# Patient Record
Sex: Female | Born: 1975 | Race: White | Hispanic: No | Marital: Married | Smoking: Former smoker
Health system: Southern US, Community
[De-identification: ages and names within clinical notes are randomized; demographics above are authoritative.]

## PROBLEM LIST (undated history)

## (undated) DIAGNOSIS — E079 Disorder of thyroid, unspecified: Secondary | ICD-10-CM

## (undated) DIAGNOSIS — M1990 Unspecified osteoarthritis, unspecified site: Secondary | ICD-10-CM

## (undated) DIAGNOSIS — K589 Irritable bowel syndrome without diarrhea: Secondary | ICD-10-CM

## (undated) DIAGNOSIS — D126 Benign neoplasm of colon, unspecified: Secondary | ICD-10-CM

## (undated) HISTORY — PX: LEEP: SHX91

## (undated) HISTORY — PX: COLONOSCOPY: SHX174

## (undated) HISTORY — DX: Irritable bowel syndrome, unspecified: K58.9

## (undated) HISTORY — DX: Disorder of thyroid, unspecified: E07.9

## (undated) HISTORY — DX: Unspecified osteoarthritis, unspecified site: M19.90

---
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
## 2015-01-24 ENCOUNTER — Other Ambulatory Visit: Payer: Self-pay | Admitting: Family

## 2015-01-24 ENCOUNTER — Other Ambulatory Visit: Payer: Self-pay

## 2015-01-24 ENCOUNTER — Other Ambulatory Visit (HOSPITAL_COMMUNITY)
Admission: RE | Admit: 2015-01-24 | Discharge: 2015-01-24 | Disposition: A | Discharge status: Home / Self Care | Payer: 59 | Source: Ambulatory Visit | Attending: Family Medicine | Admitting: Family Medicine

## 2015-01-24 DIAGNOSIS — Z01419 Encounter for gynecological examination (general) (routine) without abnormal findings: Secondary | ICD-10-CM | POA: Insufficient documentation

## 2015-01-24 DIAGNOSIS — Z1231 Encounter for screening mammogram for malignant neoplasm of breast: Secondary | ICD-10-CM

## 2015-01-25 LAB — CYTOLOGY - PAP

## 2015-02-01 ENCOUNTER — Ambulatory Visit
Admission: RE | Admit: 2015-02-01 | Discharge: 2015-02-01 | Disposition: A | Discharge status: Home / Self Care | Payer: 59 | Source: Ambulatory Visit | Attending: Family | Admitting: Family

## 2015-02-01 DIAGNOSIS — Z1231 Encounter for screening mammogram for malignant neoplasm of breast: Secondary | ICD-10-CM

## 2016-01-06 ENCOUNTER — Other Ambulatory Visit (HOSPITAL_COMMUNITY)
Admission: RE | Admit: 2016-01-06 | Discharge: 2016-01-06 | Disposition: A | Discharge status: Home / Self Care | Payer: 59 | Source: Ambulatory Visit | Attending: Family | Admitting: Family

## 2016-01-06 DIAGNOSIS — Z01419 Encounter for gynecological examination (general) (routine) without abnormal findings: Secondary | ICD-10-CM | POA: Insufficient documentation

## 2016-01-06 DIAGNOSIS — Z1151 Encounter for screening for human papillomavirus (HPV): Secondary | ICD-10-CM | POA: Insufficient documentation

## 2016-01-30 ENCOUNTER — Other Ambulatory Visit: Payer: Self-pay

## 2016-01-30 DIAGNOSIS — Z01419 Encounter for gynecological examination (general) (routine) without abnormal findings: Secondary | ICD-10-CM | POA: Diagnosis present

## 2016-01-30 DIAGNOSIS — Z1151 Encounter for screening for human papillomavirus (HPV): Secondary | ICD-10-CM | POA: Diagnosis present

## 2016-01-31 LAB — CYTOLOGY - PAP

## 2016-11-27 ENCOUNTER — Other Ambulatory Visit: Payer: Self-pay | Admitting: Internal Medicine

## 2016-11-27 DIAGNOSIS — Z1231 Encounter for screening mammogram for malignant neoplasm of breast: Secondary | ICD-10-CM

## 2016-12-19 ENCOUNTER — Ambulatory Visit
Admission: RE | Admit: 2016-12-19 | Discharge: 2016-12-19 | Disposition: A | Discharge status: Home / Self Care | Payer: 59 | Source: Ambulatory Visit | Attending: Internal Medicine | Admitting: Internal Medicine

## 2016-12-19 DIAGNOSIS — Z1231 Encounter for screening mammogram for malignant neoplasm of breast: Secondary | ICD-10-CM

## 2017-04-14 DIAGNOSIS — M545 Low back pain: Secondary | ICD-10-CM | POA: Diagnosis not present

## 2017-04-14 DIAGNOSIS — M79641 Pain in right hand: Secondary | ICD-10-CM | POA: Diagnosis not present

## 2017-04-14 DIAGNOSIS — M9903 Segmental and somatic dysfunction of lumbar region: Secondary | ICD-10-CM | POA: Diagnosis not present

## 2017-04-15 DIAGNOSIS — M9903 Segmental and somatic dysfunction of lumbar region: Secondary | ICD-10-CM | POA: Diagnosis not present

## 2017-04-15 DIAGNOSIS — M545 Low back pain: Secondary | ICD-10-CM | POA: Diagnosis not present

## 2017-04-15 DIAGNOSIS — M79641 Pain in right hand: Secondary | ICD-10-CM | POA: Diagnosis not present

## 2017-05-08 DIAGNOSIS — Z23 Encounter for immunization: Secondary | ICD-10-CM | POA: Diagnosis not present

## 2017-07-20 ENCOUNTER — Ambulatory Visit: Payer: 59 | Admitting: Family Medicine

## 2017-07-27 ENCOUNTER — Encounter: Payer: Self-pay | Admitting: Family Medicine

## 2017-07-27 ENCOUNTER — Ambulatory Visit (INDEPENDENT_AMBULATORY_CARE_PROVIDER_SITE_OTHER): Payer: 59 | Admitting: Family Medicine

## 2017-07-27 VITALS — BP 102/72 | HR 71 | Temp 98.3°F | Resp 16 | Ht 63.75 in | Wt 189.4 lb

## 2017-07-27 DIAGNOSIS — M79672 Pain in left foot: Secondary | ICD-10-CM | POA: Diagnosis not present

## 2017-07-27 DIAGNOSIS — K529 Noninfective gastroenteritis and colitis, unspecified: Secondary | ICD-10-CM

## 2017-07-27 DIAGNOSIS — R1084 Generalized abdominal pain: Secondary | ICD-10-CM

## 2017-07-27 LAB — CBC WITH DIFFERENTIAL/PLATELET
BASOS ABS: 0 10*3/uL (ref 0.0–0.1)
Basophils Relative: 0.4 % (ref 0.0–3.0)
EOS ABS: 0.7 10*3/uL (ref 0.0–0.7)
Eosinophils Relative: 9.2 % — ABNORMAL HIGH (ref 0.0–5.0)
HEMATOCRIT: 41.9 % (ref 36.0–46.0)
HEMOGLOBIN: 14.2 g/dL (ref 12.0–15.0)
LYMPHS PCT: 29.5 % (ref 12.0–46.0)
Lymphs Abs: 2.2 10*3/uL (ref 0.7–4.0)
MCHC: 33.8 g/dL (ref 30.0–36.0)
MCV: 93.1 fl (ref 78.0–100.0)
MONOS PCT: 6.1 % (ref 3.0–12.0)
Monocytes Absolute: 0.4 10*3/uL (ref 0.1–1.0)
NEUTROS ABS: 4 10*3/uL (ref 1.4–7.7)
Neutrophils Relative %: 54.8 % (ref 43.0–77.0)
Platelets: 252 10*3/uL (ref 150.0–400.0)
RBC: 4.5 Mil/uL (ref 3.87–5.11)
RDW: 12.7 % (ref 11.5–15.5)
WBC: 7.3 10*3/uL (ref 4.0–10.5)

## 2017-07-27 LAB — COMPREHENSIVE METABOLIC PANEL
ALK PHOS: 38 U/L — AB (ref 39–117)
ALT: 10 U/L (ref 0–35)
AST: 14 U/L (ref 0–37)
Albumin: 4.2 g/dL (ref 3.5–5.2)
BILIRUBIN TOTAL: 0.5 mg/dL (ref 0.2–1.2)
BUN: 11 mg/dL (ref 6–23)
CO2: 27 mEq/L (ref 19–32)
Calcium: 9.1 mg/dL (ref 8.4–10.5)
Chloride: 103 mEq/L (ref 96–112)
Creatinine, Ser: 0.81 mg/dL (ref 0.40–1.20)
GFR: 82.17 mL/min (ref >60.00)
GLUCOSE: 100 mg/dL — AB (ref 70–99)
Potassium: 4.1 mEq/L (ref 3.5–5.1)
Sodium: 138 mEq/L (ref 135–145)
TOTAL PROTEIN: 6.4 g/dL (ref 6.0–8.3)

## 2017-07-27 LAB — TSH: TSH: 3.74 u[IU]/mL (ref 0.35–4.50)

## 2017-07-27 NOTE — Patient Instructions (Addendum)
A few things to remember from today's visit:   Chronic diarrhea - Plan: Comprehensive metabolic panel, TSH, CBC with Differential/Platelet, Ova and parasite examination, Celiac Ab tTG DGP TIgA  Left foot pain - Plan: DG Foot Complete Left  Chronic Diarrhea Diarrhea is a condition in which a person passes frequent loose and watery stools. It can cause you to feel weak and dehydrated. Dehydration can make you tired and thirsty. It can also cause a dry mouth, decreased urination, and dark yellow urine. Diarrhea is a sign of another underlying problem, such as:  Infection.  Medication side effects.  Dietary intolerance, such as lactose intolerance.  Conditions such as celiac disease, irritable bowel syndrome (IBS), or inflammatory bowel disease (IBD).  In most cases, diarrhea lasts 2-3 days. Diarrhea that lasts longer than 4 weeks is called long-lasting (chronic) diarrhea. It is important that you treat your diarrhea as told by your health care provider. Follow these instructions at home: Follow these recommendations as told by your health care provider. Eating and drinking  Take an oral rehydration solution (ORS). This is a drink that is designed to keep you hydrated. It can be found at pharmacies and retail stores.  Drink clear fluids, such as water, ice chips, diluted fruit juice, and low-calorie sports drinks.  Follow the diet recommended by your health care provider. You may need to avoid foods that trigger diarrhea for you.  Avoid foods and beverages that contain a lot of sugar or caffeine.  Avoid alcohol.  Avoid spicy or fatty foods. General instructions  Drink enough fluid to keep your urine clear or pale yellow.  Wash your hands often and after each diarrhea episode. If soap and water are not available, use hand sanitizer.  Make sure that all people in your household wash their hands well and often.  Take over-the-counter and prescription medicines only as told by your  health care provider.  If you were prescribed an antibiotic medicine, take it as told by your health care provider. Do not stop taking the antibiotic even if you start to feel better.  Rest at home while you recover.  Watch your condition for any changes.  Take a warm bath to relieve any burning or pain from frequent diarrhea episodes.  Keep all follow-up visits as told by your health care provider. This is important. Contact a health care provider if:  You have a fever.  Your diarrhea gets worse or does not get better.  You have new symptoms.  You cannot drink fluids without vomiting.  You feel light-headed or dizzy.  You have a headache.  You have muscle cramps.  You have severe pain in the rectum. Get help right away if:  You have persistent vomiting.  You have chest pain.  You feel extremely weak or you faint.  You have bloody or black stools, or stools that look like tar.  You have severe pain, cramping, or bloating in your abdomen, or pain that stays in one place.  You have trouble breathing or you are breathing very quickly.  Your heart is beating very quickly.  Your skin feels cold and clammy.  You feel confused.  You have a severe headache.  You have signs of dehydration, such as: ? Dark urine, very little urine, or no urine. ? Cracked lips. ? Dry mouth. ? Sunken eyes. ? Sleepiness. ? Weakness. Summary  Chronic diarrhea is a condition in which a person passes frequent loose and watery stools for more than 4 weeks.  Diarrhea is a sign of another underlying problem.  Drink enough fluid to keep your urine clear or pale yellow to avoid dehydration.  Wash your hands often and after each diarrhea episode. If soap and water are not available, use hand sanitizer.  It is important that you treat your diarrhea as told by your health care provider. This information is not intended to replace advice given to you by your health care provider. Make sure  you discuss any questions you have with your health care provider. Document Released: 09/13/2003 Document Revised: 05/12/2016 Document Reviewed: 05/12/2016 Elsevier Interactive Patient Education  2017 [place]. Please be sure medication list is accurate. If a new problem present, please set up appointment sooner than planned today.

## 2017-07-27 NOTE — Progress Notes (Signed)
HPI:   [name] is a 42 y.o. female, who is here today to establish care.  Former PCP: Kristen Morgan Last preventive routine visit: 2017.  Chronic medical problems: Allergic rhinitis, "arthritis" of back and hip.   Concerns today: "Stomach issues", today she is complaining of "back pain", intermittent cramps associated with diarrhea. Problem has been going on "for over a number of months." It seems to be worse on Monday's morning, abdominal pain improves with defecation. Symptoms are gradually getting worse. She is not sure about exacerbating or alleviating factors.  She denies history of anxiety or depression. No Hx of travel,sick contact, or antibiotic treatment around the time she started with symptoms. She has about 2-3 stools when she is having symptoms.  Usually she has a bowel movement every 2-3 days and is usually loose. Decreased appetite.  She has no noted mucus or blood in the stool. Family history for IBS or celiac negative.  She is also complaining of left food pain, lateral aspect.  Pain is started this past summer after stepping on a rock while she was hiking.  She had "horrible" pain initially, she did not seek medical attention.  She is having pain intermittently, no deformity or local edema/erythema. No numbness, tingling, or cold extremity. She has not try OTC medication.   Review of Systems  Constitutional: Positive for appetite change. Negative for chills, fatigue, fever and unexpected weight change.  HENT: Negative for congestion, mouth sores, sore throat and trouble swallowing.   Respiratory: Negative for cough, chest tightness, shortness of breath and wheezing.   Cardiovascular: Negative for chest pain and palpitations.  Gastrointestinal: Positive for abdominal pain and diarrhea. Negative for blood in stool, nausea and vomiting.  Endocrine: Negative for cold intolerance, heat intolerance, polydipsia, polyphagia and polyuria.    Genitourinary: Negative for decreased urine volume, dysuria and hematuria.  Musculoskeletal: Positive for arthralgias and back pain (Chronic). Negative for joint swelling and neck stiffness.  Skin: Negative for rash.  Allergic/Immunologic: Positive for environmental allergies. Negative for food allergies.  Neurological: Negative for syncope, weakness, numbness and headaches.  Hematological: Negative for adenopathy. Does not bruise/bleed easily.  Psychiatric/Behavioral: Negative for confusion. The patient is not nervous/anxious.       Current Outpatient Medications on File Prior to Visit  Medication Sig Dispense Refill  . Multiple Vitamin (MULTIVITAMIN) tablet Take 1 tablet by mouth daily.     No current facility-administered medications on file prior to visit.      No past medical history on file. Allergies  Allergen Reactions  . Sulfa Antibiotics Hives and Rash    Family History  Problem Relation Age of Onset  . Breast cancer Neg Hx     Social History   Socioeconomic History  . Marital status: Married    Spouse name: None  . Number of children: None  . Years of education: None  . Highest education level: None  Social Needs  . Financial resource strain: None  . Food insecurity - worry: None  . Food insecurity - inability: None  . Transportation needs - medical: None  . Transportation needs - non-medical: None  Occupational History  . None  Tobacco Use  . Smoking status: Former Smoker    Types: Cigarettes    Last attempt to quit: 07/07/2008    Years since quitting: 9.0  . Smokeless tobacco: Never Used  Substance and Sexual Activity  . Alcohol use: Yes    Comment: 3 drinks/week, combination of wine,  beer, and liquor  . Drug use: No  . Sexual activity: Yes  Other Topics Concern  . None  Social History Narrative  . None    Vitals:   07/27/17 0746  BP: 102/72  Pulse: 71  Resp: 16  Temp: 98.3 F (36.8 C)  SpO2: 99%    Body mass index is 32.77  kg/m.   Physical Exam  Nursing note and vitals reviewed. Constitutional: She is oriented to person, place, and time. She appears well-developed. She does not appear ill. No distress.  HENT:  Head: Normocephalic and atraumatic.  Mouth/Throat: Oropharynx is clear and moist and mucous membranes are normal.  Eyes: Conjunctivae are normal. Pupils are equal, round, and reactive to light. No scleral icterus.  Neck: No tracheal deviation present. No thyroid mass and no thyromegaly present.  Cardiovascular: Normal rate and regular rhythm.  No murmur heard. Respiratory: Effort normal and breath sounds normal. No respiratory distress.  GI: Soft. Bowel sounds are normal. She exhibits no distension and no mass. There is no hepatomegaly. There is no tenderness.  Musculoskeletal: She exhibits no edema or tenderness.  Left foot: No deformity or skin changes appreciated.  No tenderness upon palpation and no significant limitation of ROM.  Lymphadenopathy:    She has no cervical adenopathy.  Neurological: She is alert and oriented to person, place, and time. She has normal strength. Gait normal.  Skin: Skin is warm. No rash noted. No erythema.  Psychiatric: She has a normal mood and affect.  Well groomed, good eye contact.    ASSESSMENT AND PLAN:  Ms. Kristen Morgan was seen today for establish care and foot pain.  Diagnoses and all orders for this visit:  Lab Results  Component Value Date   WBC 7.3 07/27/2017   HGB 14.2 07/27/2017   HCT 41.9 07/27/2017   MCV 93.1 07/27/2017   PLT 252.0 07/27/2017   Lab Results  Component Value Date   ALT 10 07/27/2017   AST 14 07/27/2017   ALKPHOS 38 (L) 07/27/2017   BILITOT 0.5 07/27/2017   Lab Results  Component Value Date   CREATININE 0.81 07/27/2017   BUN 11 07/27/2017   NA 138 07/27/2017   K 4.1 07/27/2017   CL 103 07/27/2017   CO2 27 07/27/2017   Lab Results  Component Value Date   TSH 3.74 07/27/2017     Chronic diarrhea  Possible  etiologies discussed. ? IBS-D. Adequate hydration. Further recommendations will be given according to lab results. We will consider GI referral, she may need colonoscopy.  -     Comprehensive metabolic panel -     TSH -     CBC with Differential/Platelet -     Ova and parasite examination; Future -     Celiac Ab tTG DGP TIgA -     Ova and parasite examination -     TIQ-NTM  Left foot pain  Tenosynovitis. Consider podiatry evaluation. Further recommendation will be given according to imaging results.  -     DG Foot Complete Left; Future  Abdominal pain, diffuse  Examination today negative. Instructed about warning signs. I do not think imaging is needed at this time.  -     Comprehensive metabolic panel -     CBC with Differential/Platelet -     TIQ-NTM       Denaya Horn G. Martinique, MD  [facility]. [place] office.

## 2017-07-30 LAB — CELIAC AB TTG DGP TIGA
Antigliadin Abs, IgA: 29 units — ABNORMAL HIGH (ref 0–19)
Gliadin IgG: 13 units (ref 0–19)
IgA/Immunoglobulin A, Serum: 312 mg/dL (ref 87–352)

## 2017-08-02 ENCOUNTER — Encounter: Payer: Self-pay | Admitting: Family Medicine

## 2017-08-03 ENCOUNTER — Encounter: Payer: Self-pay | Admitting: Gastroenterology

## 2017-08-07 DIAGNOSIS — D126 Benign neoplasm of colon, unspecified: Secondary | ICD-10-CM

## 2017-08-07 HISTORY — DX: Benign neoplasm of colon, unspecified: D12.6

## 2017-08-07 LAB — OVA AND PARASITE EXAMINATION
CONCENTRATE RESULT: NONE SEEN
TRICHROME RESULT: NONE SEEN

## 2017-08-07 LAB — TIQ-NTM

## 2017-08-10 ENCOUNTER — Encounter: Payer: Self-pay | Admitting: Family Medicine

## 2017-08-13 ENCOUNTER — Encounter: Payer: Self-pay | Admitting: Gastroenterology

## 2017-08-13 ENCOUNTER — Ambulatory Visit (INDEPENDENT_AMBULATORY_CARE_PROVIDER_SITE_OTHER): Payer: 59 | Admitting: Gastroenterology

## 2017-08-13 VITALS — BP 104/68 | HR 72 | Ht 64.0 in | Wt 191.4 lb

## 2017-08-13 DIAGNOSIS — R140 Abdominal distension (gaseous): Secondary | ICD-10-CM

## 2017-08-13 DIAGNOSIS — R194 Change in bowel habit: Secondary | ICD-10-CM | POA: Insufficient documentation

## 2017-08-13 HISTORY — DX: Abdominal distension (gaseous): R14.0

## 2017-08-13 HISTORY — DX: Change in bowel habit: R19.4

## 2017-08-13 MED ORDER — NA SULFATE-K SULFATE-MG SULF 17.5-3.13-1.6 GM/177ML PO SOLN: ORAL | 0 refills | Status: DC

## 2017-08-13 NOTE — Patient Instructions (Signed)
If you are age 43 or older, your body mass index should be between 23-30. Your Body mass index is 32.85 kg/m. If this is out of the aforementioned range listed, please consider follow up with your Primary Care Provider.  If you are age 4 or younger, your body mass index should be between 19-25. Your Body mass index is 32.85 kg/m. If this is out of the aformentioned range listed, please consider follow up with your Primary Care Provider.   You have been scheduled for an endoscopy and colonoscopy. Please follow the written instructions given to you at your visit today. Please pick up your prep supplies at the pharmacy within the next 1-3 days. If you use inhalers (even only as needed), please bring them with you on the day of your procedure. Your physician has requested that you go to www.startemmi.com and enter the access code given to you at your visit today. This web site gives a general overview about your procedure. However, you should still follow specific instructions given to you by our office regarding your preparation for the procedure.  We have sent the following medications to your pharmacy for you to pick up at your convenience: Suprep  Start powder Benefiber or Citrucel daily.  Start IBguard - Over the counter.  Thank you for choosing me and [facility].   Alonza Bogus, PA-C

## 2017-08-13 NOTE — Progress Notes (Signed)
08/13/2017 Kristen Morgan [id number] 26-May-1976   HISTORY OF PRESENT ILLNESS: This is a pleasant 42 year old female who is new to our office.  She is here today at the request of her PCP, Dr. Martinique, for evaluation of change in bowel habits and abdominal bloating.  She tells me that for the past year and a half she has been having a lot of fluctuations with her bowel habits.  More often she has loose/diarrheal type stools, sometimes explosive.  At times she does have some constipation as well where she even goes at least a few days without having a bowel movement.  She says that prior to that she moves her bowels normally.  Never had any issues with abdominal complaints in the past.  She describes some abdominal cramping sometimes in the right lower quadrant, but no daily pain and nothing severe.  She says it is more so of an uneasy feeling.  Has occasional nausea, but no vomiting.  The issues have become more frequent and more interfering with her life over the past for 5 months.  She denies seeing blood or mucus in her stool.  She complains of a lot of bloating and gas pressure.  She has tried Gas-X without relief.  CBC, TSH, CMP were all unremarkable.  Stool study for ova and parasites was negative.  She did have a celiac panel drawn.  Her IgA level was normal and her IgA was negative, but her antigliadin IgA antibody was elevated at 29.  She really cannot identify any specific foods that bother her on a regular basis, but does admit that ice cream and milk do not usually agree with her, but she is usually able to eat cheese, cottage cheese, yogurt.   Past Medical History:  Diagnosis Date  . Arthritis    back/ right hip   Past Surgical History:  Procedure Laterality Date  . CESAREAN SECTION     x 2    reports that she quit smoking about 9 years ago. Her smoking use included cigarettes. she has never used smokeless tobacco. She reports that she drinks alcohol. She reports that she does  not use drugs. family history includes Arthritis in her father; Thyroid disease in her maternal aunt, maternal grandmother, and mother. Allergies  Allergen Reactions  . Sulfa Antibiotics Hives and Rash      Outpatient Encounter Medications as of 08/13/2017  Medication Sig  . Multiple Vitamin (MULTIVITAMIN) tablet Take 1 tablet by mouth daily.  . Na Sulfate-K Sulfate-Mg Sulf 17.5-3.13-1.6 GM/177ML SOLN Suprep-Use as directed   No facility-administered encounter medications on file as of 08/13/2017.      REVIEW OF SYSTEMS  : All other systems reviewed and negative except where noted in the History of Present Illness.   PHYSICAL EXAM: BP 104/68   Pulse 72   Ht 5\' 4"  (1.626 m)   Wt 191 lb 6 oz (86.8 kg)   LMP 07/20/2017 (Approximate)   BMI 32.85 kg/m  General: Well developed white female in no acute distress Head: Normocephalic and atraumatic Eyes:  Sclerae anicteric, conjunctiva pink. Ears: Normal auditory acuity; no increased WOB. Heart: Regular rate and rhythm; no M/R/G. Abdomen: Soft, non-distended.  BS present.  Non-tender. Rectal:  Will be done at the time of colonoscopy. Musculoskeletal: Symmetrical with no gross deformities  Skin: No lesions on visible extremities Extremities: No edema  Neurological: Alert oriented x 4, grossly non-focal Psychological:  Alert and cooperative. Normal mood and affect  ASSESSMENT AND PLAN: *  Change in bowel habits with gas and bloating:  Alternates between constipation and loose stools/diarrhea.  This has been a change for her over the past 1.5 years as she used to move her bowels regularly.  Celiac panel showed negative TTG IgA but Antigliadin Ab is positive.  Will schedule for colonoscopy, but will also schedule for EGD with small bowel biopsies to rule out celiac.  I have asked her to begin using a daily powder fiber supplement such as Benefiber or Citrucel.  Recommended trial of IBgard for bloating.  **The risks, benefits, and alternatives  to EGD and colonoscopy were discussed with the patient and she consents to proceed.   CC:  Martinique, Betty G, MD

## 2017-08-14 NOTE — Progress Notes (Signed)
Reviewed and agree with management plan.  Malcolm T. Stark, MD FACG 

## 2017-08-20 ENCOUNTER — Encounter: Payer: Self-pay | Admitting: Gastroenterology

## 2017-08-31 ENCOUNTER — Ambulatory Visit: Payer: 59 | Admitting: Internal Medicine

## 2017-09-02 ENCOUNTER — Encounter: Payer: Self-pay | Admitting: Gastroenterology

## 2017-09-02 ENCOUNTER — Ambulatory Visit (AMBULATORY_SURGERY_CENTER): Payer: 59 | Admitting: Gastroenterology

## 2017-09-02 ENCOUNTER — Other Ambulatory Visit: Payer: Self-pay

## 2017-09-02 VITALS — BP 105/65 | HR 70 | Temp 99.1°F | Resp 11 | Ht 64.0 in | Wt 191.0 lb

## 2017-09-02 DIAGNOSIS — D124 Benign neoplasm of descending colon: Secondary | ICD-10-CM

## 2017-09-02 DIAGNOSIS — D123 Benign neoplasm of transverse colon: Secondary | ICD-10-CM

## 2017-09-02 DIAGNOSIS — R140 Abdominal distension (gaseous): Secondary | ICD-10-CM

## 2017-09-02 DIAGNOSIS — D122 Benign neoplasm of ascending colon: Secondary | ICD-10-CM | POA: Diagnosis not present

## 2017-09-02 DIAGNOSIS — R194 Change in bowel habit: Secondary | ICD-10-CM | POA: Diagnosis present

## 2017-09-02 DIAGNOSIS — D125 Benign neoplasm of sigmoid colon: Secondary | ICD-10-CM

## 2017-09-02 DIAGNOSIS — K635 Polyp of colon: Secondary | ICD-10-CM

## 2017-09-02 DIAGNOSIS — R894 Abnormal immunological findings in specimens from other organs, systems and tissues: Secondary | ICD-10-CM | POA: Diagnosis not present

## 2017-09-02 MED ORDER — SODIUM CHLORIDE 0.9 % IV SOLN
500.0000 mL | Freq: Once | INTRAVENOUS | Status: DC
Start: 2017-09-02 — End: 2021-08-13

## 2017-09-02 NOTE — Op Note (Signed)
[facility] Patient Name: Kristen Morgan Procedure Date: 09/02/2017 1:23 PM MRN: [id number] Endoscopist: Ladene Artist , MD Age: 42 Referring MD:  Date of Birth: Sep 26, 1975 Gender: Female Account #: [id number] Procedure:                Colonoscopy Indications:              Change in bowel habits Medicines:                Monitored Anesthesia Care Procedure:                Pre-Anesthesia Assessment:                           - Prior to the procedure, a History and Physical                            was performed, and patient medications and                            allergies were reviewed. The patient's tolerance of                            previous anesthesia was also reviewed. The risks                            and benefits of the procedure and the sedation                            options and risks were discussed with the patient.                            All questions were answered, and informed consent                            was obtained. Prior Anticoagulants: The patient has                            taken no previous anticoagulant or antiplatelet                            agents. ASA Grade Assessment: II - A patient with                            mild systemic disease. After reviewing the risks                            and benefits, the patient was deemed in                            satisfactory condition to undergo the procedure.                           After obtaining informed consent, the colonoscope  was passed under direct vision. Throughout the                            procedure, the patient's blood pressure, pulse, and                            oxygen saturations were monitored continuously. The                            Colonoscope was introduced through the anus and                            advanced to the the cecum, identified by                            appendiceal orifice and ileocecal valve. The                             terminal ileum, ileocecal valve, appendiceal                            orifice, and rectum were photographed. The quality                            of the bowel preparation was excellent. The                            colonoscopy was performed without difficulty. The                            patient tolerated the procedure well. Scope In: 1:37:28 PM Scope Out: 1:57:47 PM Scope Withdrawal Time: 0 hours 17 minutes 19 seconds  Total Procedure Duration: 0 hours 20 minutes 19 seconds  Findings:                 The perianal and digital rectal examinations were                            normal.                           The terminal ileum appeared normal.                           Seven sessile polyps were found in the sigmoid                            colon, descending colon, transverse colon and                            ascending colon. The polyps were 6 to 8 mm in size.                            These polyps were removed with a cold snare.  Resection and retrieval were complete.                           The exam was otherwise without abnormality on                            direct and retroflexion views. Complications:            No immediate complications. Estimated blood loss:                            None. Estimated Blood Loss:     Estimated blood loss: none. Impression:               - The examined portion of the ileum was normal.                           - Seven 6 to 8 mm polyps in the sigmoid colon, in                            the descending colon, in the transverse colon and                            in the ascending colon, removed with a cold snare.                            Resected and retrieved.                           - The examination was otherwise normal on direct                            and retroflexion views. Recommendation:           - Repeat colonoscopy in 3 - 5 years for                             surveillance pending pathology review.                           - Patient has a contact number available for                            emergencies. The signs and symptoms of potential                            delayed complications were discussed with the                            patient. Return to normal activities tomorrow.                            Written discharge instructions were provided to the  patient.                           - Resume previous diet.                           - Continue present medications.                           - Await pathology results. Ladene Artist, MD 09/02/2017 2:01:22 PM This report has been signed electronically.

## 2017-09-02 NOTE — Op Note (Signed)
[facility] Patient Name: Kristen Morgan Procedure Date: 09/02/2017 1:23 PM MRN: [id number] Endoscopist: Ladene Artist , MD Age: 42 Referring MD:  Date of Birth: [birth year] Gender: Female Account #: [id number] Procedure:                Upper GI endoscopy Indications:              Abnormal celiac antibody test Medicines:                Monitored Anesthesia Care Procedure:                Pre-Anesthesia Assessment:                           - Prior to the procedure, a History and Physical                            was performed, and patient medications and                            allergies were reviewed. The patient's tolerance of                            previous anesthesia was also reviewed. The risks                            and benefits of the procedure and the sedation                            options and risks were discussed with the patient.                            All questions were answered, and informed consent                            was obtained. Prior Anticoagulants: The patient has                            taken no previous anticoagulant or antiplatelet                            agents. ASA Grade Assessment: II - A patient with                            mild systemic disease. After reviewing the risks                            and benefits, the patient was deemed in                            satisfactory condition to undergo the procedure.                           After obtaining informed consent, the endoscope was  passed under direct vision. Throughout the                            procedure, the patient's blood pressure, pulse, and                            oxygen saturations were monitored continuously. The                            Endoscope was introduced through the mouth, and                            advanced to the second part of duodenum. The upper                            GI endoscopy was  accomplished without difficulty.                            The patient tolerated the procedure well. Scope In: Scope Out: Findings:                 The esophagus was normal.                           The stomach was normal.                           The examined duodenum was normal. Biopsies for                            histology were taken with a cold forceps for                            evaluation of celiac disease.                           The cardia and gastric fundus were normal on                            retroflexion. Complications:            No immediate complications. Estimated Blood Loss:     Estimated blood loss: none. Impression:               - Normal esophagus.                           - Normal stomach.                           - Normal examined duodenum. Biopsied. Recommendation:           - Patient has a contact number available for                            emergencies. The signs and symptoms of potential  delayed complications were discussed with the                            patient. Return to normal activities tomorrow.                            Written discharge instructions were provided to the                            patient.                           - Resume previous diet.                           - Continue present medications.                           - Await pathology results. Ladene Artist, MD 09/02/2017 2:09:10 PM This report has been signed electronically.

## 2017-09-02 NOTE — Progress Notes (Signed)
Called to room to assist during endoscopic procedure.  Patient ID and intended procedure confirmed with present staff. Received instructions for my participation in the procedure from the performing physician.  

## 2017-09-02 NOTE — Patient Instructions (Signed)
Handout given : Polyps  YOU HAD AN ENDOSCOPIC PROCEDURE TODAY AT [facility]:   Refer to the procedure report that was given to you for any specific questions about what was found during the examination.  If the procedure report does not answer your questions, please call your gastroenterologist to clarify.  If you requested that your care partner not be given the details of your procedure findings, then the procedure report has been included in a sealed envelope for you to review at your convenience later.  YOU SHOULD EXPECT: Some feelings of bloating in the abdomen. Passage of more gas than usual.  Walking can help get rid of the air that was put into your GI tract during the procedure and reduce the bloating. If you had a lower endoscopy (such as a colonoscopy or flexible sigmoidoscopy) you may notice spotting of blood in your stool or on the toilet paper. If you underwent a bowel prep for your procedure, you may not have a normal bowel movement for a few days.  Please Note:  You might notice some irritation and congestion in your nose or some drainage.  This is from the oxygen used during your procedure.  There is no need for concern and it should clear up in a day or so.  SYMPTOMS TO REPORT IMMEDIATELY:   Following lower endoscopy (colonoscopy or flexible sigmoidoscopy):  Excessive amounts of blood in the stool  Significant tenderness or worsening of abdominal pains  Swelling of the abdomen that is new, acute  Fever of 100F or higher   Following upper endoscopy (EGD)  Vomiting of blood or coffee ground material  New chest pain or pain under the shoulder blades  Painful or persistently difficult swallowing  New shortness of breath  Fever of 100F or higher  Black, tarry-looking stools  For urgent or emergent issues, a gastroenterologist can be reached at any hour by calling [phone].   DIET:  We do recommend a small meal at first, but then you may proceed to  your regular diet.  Drink plenty of fluids but you should avoid alcoholic beverages for 24 hours.  ACTIVITY:  You should plan to take it easy for the rest of today and you should NOT DRIVE or use heavy machinery until tomorrow (because of the sedation medicines used during the test).    FOLLOW UP: Our staff will call the number listed on your records the next business day following your procedure to check on you and address any questions or concerns that you may have regarding the information given to you following your procedure. If we do not reach you, we will leave a message.  However, if you are feeling well and you are not experiencing any problems, there is no need to return our call.  We will assume that you have returned to your regular daily activities without incident.  If any biopsies were taken you will be contacted by phone or by letter within the next 1-3 weeks.  Please call us at [phone] if you have not heard about the biopsies in 3 weeks.    SIGNATURES/CONFIDENTIALITY: You and/or your care partner have signed paperwork which will be entered into your electronic medical record.  These signatures attest to the fact that that the information above on your After Visit Summary has been reviewed and is understood.  Full responsibility of the confidentiality of this discharge information lies with you and/or your care-partner.

## 2017-09-02 NOTE — Progress Notes (Signed)
To PACU, VSS. Report to [email or web address] 

## 2017-09-03 ENCOUNTER — Telehealth: Payer: Self-pay

## 2017-09-03 NOTE — Telephone Encounter (Signed)
  Follow up Call-  Call back number 09/02/2017  Post procedure Call Back phone  # [phone]  Permission to leave phone message Yes     Patient questions:  Do you have a fever, pain , or abdominal swelling? No. Pain Score  0 *  Have you tolerated food without any problems? Yes.    Have you been able to return to your normal activities? Yes.    Do you have any questions about your discharge instructions: Diet   No. Medications  No. Follow up visit  No.  Do you have questions or concerns about your Care? No.  Actions: * If pain score is 4 or above: No action needed, pain <4.

## 2017-09-10 ENCOUNTER — Encounter: Payer: Self-pay | Admitting: Gastroenterology

## 2017-10-21 DIAGNOSIS — M9903 Segmental and somatic dysfunction of lumbar region: Secondary | ICD-10-CM | POA: Diagnosis not present

## 2017-10-21 DIAGNOSIS — M9904 Segmental and somatic dysfunction of sacral region: Secondary | ICD-10-CM | POA: Diagnosis not present

## 2017-10-21 DIAGNOSIS — M5441 Lumbago with sciatica, right side: Secondary | ICD-10-CM | POA: Diagnosis not present

## 2017-10-22 DIAGNOSIS — M9904 Segmental and somatic dysfunction of sacral region: Secondary | ICD-10-CM | POA: Diagnosis not present

## 2017-10-22 DIAGNOSIS — M9903 Segmental and somatic dysfunction of lumbar region: Secondary | ICD-10-CM | POA: Diagnosis not present

## 2017-10-22 DIAGNOSIS — M5441 Lumbago with sciatica, right side: Secondary | ICD-10-CM | POA: Diagnosis not present

## 2017-10-24 DIAGNOSIS — H6503 Acute serous otitis media, bilateral: Secondary | ICD-10-CM | POA: Diagnosis not present

## 2017-11-24 DIAGNOSIS — M9903 Segmental and somatic dysfunction of lumbar region: Secondary | ICD-10-CM | POA: Diagnosis not present

## 2017-11-24 DIAGNOSIS — M9904 Segmental and somatic dysfunction of sacral region: Secondary | ICD-10-CM | POA: Diagnosis not present

## 2017-11-24 DIAGNOSIS — M5441 Lumbago with sciatica, right side: Secondary | ICD-10-CM | POA: Diagnosis not present

## 2017-11-25 DIAGNOSIS — M9904 Segmental and somatic dysfunction of sacral region: Secondary | ICD-10-CM | POA: Diagnosis not present

## 2017-11-25 DIAGNOSIS — M5441 Lumbago with sciatica, right side: Secondary | ICD-10-CM | POA: Diagnosis not present

## 2017-11-25 DIAGNOSIS — M9903 Segmental and somatic dysfunction of lumbar region: Secondary | ICD-10-CM | POA: Diagnosis not present

## 2017-11-26 DIAGNOSIS — M9904 Segmental and somatic dysfunction of sacral region: Secondary | ICD-10-CM | POA: Diagnosis not present

## 2017-11-26 DIAGNOSIS — M5441 Lumbago with sciatica, right side: Secondary | ICD-10-CM | POA: Diagnosis not present

## 2017-11-26 DIAGNOSIS — M9903 Segmental and somatic dysfunction of lumbar region: Secondary | ICD-10-CM | POA: Diagnosis not present

## 2017-12-23 DIAGNOSIS — Z0000 Encounter for general adult medical examination without abnormal findings: Secondary | ICD-10-CM | POA: Diagnosis not present

## 2017-12-24 DIAGNOSIS — Z131 Encounter for screening for diabetes mellitus: Secondary | ICD-10-CM | POA: Diagnosis not present

## 2017-12-24 DIAGNOSIS — Z0000 Encounter for general adult medical examination without abnormal findings: Secondary | ICD-10-CM | POA: Diagnosis not present

## 2017-12-24 DIAGNOSIS — Z136 Encounter for screening for cardiovascular disorders: Secondary | ICD-10-CM | POA: Diagnosis not present

## 2018-01-18 DIAGNOSIS — M9903 Segmental and somatic dysfunction of lumbar region: Secondary | ICD-10-CM | POA: Diagnosis not present

## 2018-01-18 DIAGNOSIS — M9904 Segmental and somatic dysfunction of sacral region: Secondary | ICD-10-CM | POA: Diagnosis not present

## 2018-01-18 DIAGNOSIS — M5431 Sciatica, right side: Secondary | ICD-10-CM | POA: Diagnosis not present

## 2018-01-18 DIAGNOSIS — Z0000 Encounter for general adult medical examination without abnormal findings: Secondary | ICD-10-CM | POA: Diagnosis not present

## 2018-01-20 ENCOUNTER — Other Ambulatory Visit (HOSPITAL_COMMUNITY)
Admission: RE | Admit: 2018-01-20 | Discharge: 2018-01-20 | Disposition: A | Discharge status: Home / Self Care | Payer: 59 | Source: Ambulatory Visit | Attending: Obstetrics and Gynecology | Admitting: Obstetrics and Gynecology

## 2018-01-20 ENCOUNTER — Other Ambulatory Visit: Payer: Self-pay | Admitting: Obstetrics and Gynecology

## 2018-01-20 DIAGNOSIS — M9904 Segmental and somatic dysfunction of sacral region: Secondary | ICD-10-CM | POA: Diagnosis not present

## 2018-01-20 DIAGNOSIS — M5431 Sciatica, right side: Secondary | ICD-10-CM | POA: Diagnosis not present

## 2018-01-20 DIAGNOSIS — Z01411 Encounter for gynecological examination (general) (routine) with abnormal findings: Secondary | ICD-10-CM | POA: Insufficient documentation

## 2018-01-20 DIAGNOSIS — M9903 Segmental and somatic dysfunction of lumbar region: Secondary | ICD-10-CM | POA: Diagnosis not present

## 2018-01-25 LAB — CYTOLOGY - PAP
HPV 16/18/45 genotyping: POSITIVE — AB
HPV: DETECTED — AB

## 2018-03-03 DIAGNOSIS — Z23 Encounter for immunization: Secondary | ICD-10-CM | POA: Diagnosis not present

## 2018-03-03 DIAGNOSIS — K580 Irritable bowel syndrome with diarrhea: Secondary | ICD-10-CM | POA: Diagnosis not present

## 2018-03-17 ENCOUNTER — Other Ambulatory Visit: Payer: Self-pay | Admitting: Obstetrics and Gynecology

## 2018-03-17 DIAGNOSIS — N72 Inflammatory disease of cervix uteri: Secondary | ICD-10-CM | POA: Diagnosis not present

## 2018-03-17 DIAGNOSIS — N86 Erosion and ectropion of cervix uteri: Secondary | ICD-10-CM | POA: Diagnosis not present

## 2018-04-06 ENCOUNTER — Other Ambulatory Visit: Payer: Self-pay | Admitting: Family Medicine

## 2018-04-06 DIAGNOSIS — Z1231 Encounter for screening mammogram for malignant neoplasm of breast: Secondary | ICD-10-CM

## 2018-04-14 DIAGNOSIS — L309 Dermatitis, unspecified: Secondary | ICD-10-CM | POA: Diagnosis not present

## 2018-04-16 DIAGNOSIS — B09 Unspecified viral infection characterized by skin and mucous membrane lesions: Secondary | ICD-10-CM | POA: Diagnosis not present

## 2018-05-11 ENCOUNTER — Ambulatory Visit
Admission: RE | Admit: 2018-05-11 | Discharge: 2018-05-11 | Disposition: A | Discharge status: Home / Self Care | Payer: 59 | Source: Ambulatory Visit | Attending: Family Medicine | Admitting: Family Medicine

## 2018-05-11 DIAGNOSIS — Z1231 Encounter for screening mammogram for malignant neoplasm of breast: Secondary | ICD-10-CM | POA: Diagnosis not present

## 2018-05-14 DIAGNOSIS — J988 Other specified respiratory disorders: Secondary | ICD-10-CM | POA: Diagnosis not present

## 2018-07-28 DIAGNOSIS — M25551 Pain in right hip: Secondary | ICD-10-CM | POA: Diagnosis not present

## 2018-07-28 DIAGNOSIS — M545 Low back pain: Secondary | ICD-10-CM | POA: Diagnosis not present

## 2018-07-28 DIAGNOSIS — M9903 Segmental and somatic dysfunction of lumbar region: Secondary | ICD-10-CM | POA: Diagnosis not present

## 2018-08-12 DIAGNOSIS — Z23 Encounter for immunization: Secondary | ICD-10-CM | POA: Diagnosis not present

## 2018-08-31 DIAGNOSIS — M25551 Pain in right hip: Secondary | ICD-10-CM | POA: Diagnosis not present

## 2018-08-31 DIAGNOSIS — M545 Low back pain: Secondary | ICD-10-CM | POA: Diagnosis not present

## 2018-08-31 DIAGNOSIS — M9903 Segmental and somatic dysfunction of lumbar region: Secondary | ICD-10-CM | POA: Diagnosis not present

## 2018-09-01 DIAGNOSIS — M25551 Pain in right hip: Secondary | ICD-10-CM | POA: Diagnosis not present

## 2018-09-01 DIAGNOSIS — M545 Low back pain: Secondary | ICD-10-CM | POA: Diagnosis not present

## 2018-09-01 DIAGNOSIS — M9903 Segmental and somatic dysfunction of lumbar region: Secondary | ICD-10-CM | POA: Diagnosis not present

## 2019-01-27 ENCOUNTER — Telehealth: Payer: 59 | Admitting: Physician Assistant

## 2019-01-27 DIAGNOSIS — Z20822 Contact with and (suspected) exposure to covid-19: Secondary | ICD-10-CM

## 2019-01-27 DIAGNOSIS — Z20828 Contact with and (suspected) exposure to other viral communicable diseases: Secondary | ICD-10-CM

## 2019-01-27 NOTE — Progress Notes (Signed)
I have spent 5 minutes in review of e-visit questionnaire, review and updating patient chart, medical decision making and response to patient.   Statia Burdick Cody Arne Schlender, PA-C    

## 2019-01-27 NOTE — Progress Notes (Signed)
E-Visit for Corona Virus Screening   Your current symptoms could be consistent with the coronavirus.  Many health care providers can now test patients at their office but not all are.  [facility] has multiple testing sites. For information on our COVID testing locations and hours go to [email or web address]  Please quarantine yourself while awaiting your test results.  We are enrolling you in our [facility] for [place] . Daily you will receive a questionnaire within the [facility] website. Our COVID 19 response team willl be monitoriing your responses daily.    COVID-19 is a respiratory illness with symptoms that are similar to the flu. Symptoms are typically mild to moderate, but there have been cases of severe illness and death due to the virus. The following symptoms may appear 2-14 days after exposure: . Fever . Cough . Shortness of breath or difficulty breathing . Chills . Repeated shaking with chills . Muscle pain . Headache . Sore throat . New loss of taste or smell . Fatigue . Congestion or runny nose . Nausea or vomiting . Diarrhea  It is vitally important that if you feel that you have an infection such as this virus or any other virus that you stay home and away from places where you may spread it to others.  You should self-quarantine for 14 days if you have symptoms that could potentially be coronavirus or have been in close contact a with a person diagnosed with COVID-19 within the last 2 weeks. You should avoid contact with people age 42 and older.   You should wear a mask or cloth face covering over your nose and mouth if you must be around other people or animals, including pets (even at home). Try to stay at least 6 feet away from other people. This will protect the people around you.  You may also take acetaminophen (Tylenol) as needed for fever.   Reduce your risk of any infection by using the same precautions used for avoiding the  common cold or flu:  Marland Kitchen Wash your hands often with soap and warm water for at least 20 seconds.  If soap and water are not readily available, use an alcohol-based hand sanitizer with at least 60% alcohol.  . If coughing or sneezing, cover your mouth and nose by coughing or sneezing into the elbow areas of your shirt or coat, into a tissue or into your sleeve (not your hands). . Avoid shaking hands with others and consider head nods or verbal greetings only. . Avoid touching your eyes, nose, or mouth with unwashed hands.  . Avoid close contact with people who are sick. . Avoid places or events with large numbers of people in one location, like concerts or sporting events. . Carefully consider travel plans you have or are making. . If you are planning any travel outside or inside the [place], visit the CDC's Travelers' Health webpage for the latest health notices. . If you have some symptoms but not all symptoms, continue to monitor at home and seek medical attention if your symptoms worsen. . If you are having a medical emergency, call 911.  HOME CARE . Only take medications as instructed by your medical team. . Drink plenty of fluids and get plenty of rest. . A steam or ultrasonic humidifier can help if you have congestion.   GET HELP RIGHT AWAY IF YOU HAVE EMERGENCY WARNING SIGNS** FOR COVID-19. If you or someone is showing any of these signs seek emergency medical care immediately. Call  911 or proceed to your closest emergency facility if: . You develop worsening high fever. . Trouble breathing . Bluish lips or face . Persistent pain or pressure in the chest . New confusion . Inability to wake or stay awake . You cough up blood. . Your symptoms become more severe  **This list is not all possible symptoms. Contact your medical provider for any symptoms that are sever or concerning to you.   MAKE SURE YOU   Understand these instructions.  Will watch your condition.  Will get help right  away if you are not doing well or get worse.  Your e-visit answers were reviewed by a board certified advanced clinical practitioner to complete your personal care plan.  Depending on the condition, your plan could have included both over the counter or prescription medications.  If there is a problem please reply once you have received a response from your provider.  Your safety is important to [place].  If you have drug allergies check your prescription carefully.    You can use MyChart to ask questions about today's visit, request a non-urgent call back, or ask for a work or school excuse for 24 hours related to this e-Visit. If it has been greater than 24 hours you will need to follow up with your provider, or enter a new e-Visit to address those concerns. You will get an e-mail in the next two days asking about your experience.  I hope that your e-visit has been valuable and will speed your recovery. Thank you for using e-visits.

## 2019-01-28 ENCOUNTER — Other Ambulatory Visit: Payer: Self-pay

## 2019-01-28 DIAGNOSIS — Z20822 Contact with and (suspected) exposure to covid-19: Secondary | ICD-10-CM

## 2019-01-29 ENCOUNTER — Encounter (INDEPENDENT_AMBULATORY_CARE_PROVIDER_SITE_OTHER): Payer: Self-pay

## 2019-01-30 ENCOUNTER — Encounter (INDEPENDENT_AMBULATORY_CARE_PROVIDER_SITE_OTHER): Payer: Self-pay

## 2019-01-31 ENCOUNTER — Encounter (INDEPENDENT_AMBULATORY_CARE_PROVIDER_SITE_OTHER): Payer: Self-pay

## 2019-01-31 LAB — NOVEL CORONAVIRUS, NAA: SARS-CoV-2, NAA: NOT DETECTED

## 2019-02-23 ENCOUNTER — Ambulatory Visit (INDEPENDENT_AMBULATORY_CARE_PROVIDER_SITE_OTHER): Payer: 59 | Admitting: Gastroenterology

## 2019-02-23 ENCOUNTER — Encounter: Payer: Self-pay | Admitting: Gastroenterology

## 2019-02-23 VITALS — BP 110/80 | HR 64 | Temp 97.7°F | Ht 64.0 in | Wt 187.0 lb

## 2019-02-23 DIAGNOSIS — K581 Irritable bowel syndrome with constipation: Secondary | ICD-10-CM | POA: Diagnosis not present

## 2019-02-23 DIAGNOSIS — R1030 Lower abdominal pain, unspecified: Secondary | ICD-10-CM

## 2019-02-23 NOTE — Patient Instructions (Signed)
Take a laxative (Smooth move) every other day to every day to regulate your bowels. You can also take Miralax daily if needed for constipation.   Call back if your symptoms have not improved.   Normal BMI (Body Mass Index- based on height and weight) is between 19 and 25. Your BMI today is Body mass index is 32.1 kg/m. Marland Kitchen Please consider follow up  regarding your BMI with your Primary Care Provider.   Thank you for choosing me and [facility].  Pricilla Riffle. Dagoberto Ligas., MD., Marval Regal

## 2019-02-23 NOTE — Progress Notes (Signed)
    History of Present Illness: This is a 43 year old female previously evaluated for lower abdominal pain bloating and bowel habit changes.  She underwent EGD and colonoscopy in February 2019.  Duodenal biopsies were negative for sprue.  7 TAs and SSPs were removed at colonoscopy.  She relates difficulties with constipation and lower abdominal pain and after 3 to 4 days when she has a bowel movement she then has diarrhea and multiple bowel movements.  She notes of grumbling and bloated sensation several times.  She has been using a laxative called smooth move intermittently which has provided good results.    Current Medications, Allergies, Past Medical History, Past Surgical History, Family History and Social History were reviewed in [employer] record.   Physical Exam: General: Well developed, well nourished, no acute distress Head: Normocephalic and atraumatic Eyes:  sclerae anicteric, EOMI Ears: Normal auditory acuity Mouth: No deformity or lesions Lungs: Clear throughout to auscultation Heart: Regular rate and rhythm; no murmurs, rubs or bruits Abdomen: Soft, mild lower abdominal tenderness and non distended. No masses, hepatosplenomegaly or hernias noted. Normal Bowel sounds Rectal: Not done Musculoskeletal: Symmetrical with no gross deformities  Pulses:  Normal pulses noted Extremities: No clubbing, cyanosis, edema or deformities noted Neurological: Alert oriented x 4, grossly nonfocal Psychological:  Alert and cooperative. Normal mood and affect   Assessment and Recommendations:  1. IBS, constipation.  Begin regular, scheduled laxative usage to achieve a complete bowel movement daily or at least every other day.  Initially smooth move daily adjusted as necessary to twice daily or every other day.  If this is not effective then MiraLAX twice daily to every other day adjusted as necessary.  She is advised to call in 1 month if her symptoms are not under very  good control. If they are under good control with above measures then follow up with PCP for ongoing care.   2. Lactose intolerant.  Minimize or avoid lactose products.  3.  Personal history of multiple TAs, SSPs.  A 3-year interval colonoscopy is recommended in February 2022.

## 2019-04-06 ENCOUNTER — Other Ambulatory Visit: Payer: Self-pay | Admitting: Obstetrics and Gynecology

## 2019-04-06 ENCOUNTER — Other Ambulatory Visit (HOSPITAL_COMMUNITY)
Admission: RE | Admit: 2019-04-06 | Discharge: 2019-04-06 | Disposition: A | Discharge status: Home / Self Care | Payer: 59 | Source: Ambulatory Visit | Attending: Obstetrics and Gynecology | Admitting: Obstetrics and Gynecology

## 2019-04-06 DIAGNOSIS — R87810 Cervical high risk human papillomavirus (HPV) DNA test positive: Secondary | ICD-10-CM | POA: Diagnosis present

## 2019-04-12 LAB — CYTOLOGY - PAP
Diagnosis: HIGH — AB
HPV 16: POSITIVE — AB
HPV 18 / 45: NEGATIVE
High risk HPV: POSITIVE — AB

## 2019-04-28 ENCOUNTER — Other Ambulatory Visit: Payer: Self-pay | Admitting: Obstetrics and Gynecology

## 2019-06-09 ENCOUNTER — Encounter (HOSPITAL_BASED_OUTPATIENT_CLINIC_OR_DEPARTMENT_OTHER): Payer: Self-pay | Admitting: *Deleted

## 2019-06-09 ENCOUNTER — Other Ambulatory Visit: Payer: Self-pay

## 2019-06-09 NOTE — Progress Notes (Signed)
Spoke with patient via telephone for pre op interview. NPO after MN. No medications AM of surgery. Will need UPT and CBC AM of surgery. Arrival time 1045.

## 2019-06-11 ENCOUNTER — Other Ambulatory Visit (HOSPITAL_COMMUNITY)
Admission: RE | Admit: 2019-06-11 | Discharge: 2019-06-11 | Disposition: A | Discharge status: Home / Self Care | Payer: 59 | Source: Ambulatory Visit | Attending: Obstetrics and Gynecology | Admitting: Obstetrics and Gynecology

## 2019-06-11 DIAGNOSIS — Z01812 Encounter for preprocedural laboratory examination: Secondary | ICD-10-CM | POA: Insufficient documentation

## 2019-06-11 DIAGNOSIS — Z20828 Contact with and (suspected) exposure to other viral communicable diseases: Secondary | ICD-10-CM | POA: Diagnosis not present

## 2019-06-13 LAB — NOVEL CORONAVIRUS, NAA (HOSP ORDER, SEND-OUT TO REF LAB; TAT 18-24 HRS): SARS-CoV-2, NAA: NOT DETECTED

## 2019-06-14 NOTE — H&P (Signed)
History of Present Illness  General:          43 y/o female presents for preoperative examination prior to CKC and Hyst/D&C due to pap/colpo discrepancy. Mirena IUD to be inserted after procedure.        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         Thinks she may be on menses during procedure.         She has h/o chronic back pain.        H/o IBS- constipation.        Currently in a relationship, denies having sex regularly or in the last couple of weeks. Isolation Precautions:          Respiratory Illness Screening             1. Is fever present / reported?  No           2. Are respiratory illness symptom(s) present / reported?  No           3. Are other symptom(s) present / reported?  No           5. Has there been reported travel to a High Risk respiratory illness region?  Unknown           6. Has close* contact with person(s) known to have communicable illness been reported?  No        Has patient been tested for COVID-19? No.     Current Medications  TakingProbiotic - Capsule as directed Orally Once a day    Lysteda 650 MG Tablet 2 tablets Orally Three times daily x up to 5 days w/ menses    Multivitamin - Tablet 1 tablet Orally Once a day    Omega 3 1 capsule Orally Once a day    Not-TakingIBgard(Peppermint Oil) ? 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        low back pain- PT 2014 after normal lumbar spine xray- ortho.        IBS-constipation.            Surgical History  C-Section 2011  C-Section 2013      Family History  Father: alive 37 yrs, healthy  Mother: alive 1 yrs, healthy  Maternal Grand Father: colon cancer- in his 32s  Brother 19: alive 77 yrs   Brother2: alive 36 yrs  Paternal Grand Mother: diagnosed with Diabetes  2 brother(s) . 2 son(s) - healthy.   Patient denies family history of early CVA or CAD.      Social History  General:         no EXPOSURE TO PASSIVE SMOKE.        Alcohol: yes, 2 servings per week.        DIET: regular, drinks water.        EDUCATION: [employer].        Seat belt use: yes.        Home smoke detector use: yes.        no Recreational drug use.        Exercise:  yes, walking 5 times a week 3-5 miles a day.        Children: 2, Boys ages 32 & 8.        Caffeine: yes, chai 1 serving daily.        Tobacco use               cigarettes:  Former smoker             Quit in year  2011             Tobacco history last updated  06/07/2019             Vaping  No       Marital Status: married.        OCCUPATION: employed, Herbalist.     Gyn History  Sexual activity not currently sexually active.   Periods : every month.   LMP 05/16/2019.   Denies H/O Birth control.   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   Last mammogram date 05/11/2018.   Abnormal pap smear treated with LEEP 06/2014, [place].   STD HPV.      OB History  Number of pregnancies  3.   miscarriages  1.   Pregnancy # 1  C-section delivery.   Pregnancy # 2  C-section.   Pregnancy # 3  miscarriage 2013.      Allergies  sulfa: rash - Allergy      Hospitalization/Major Diagnostic Procedure  surg       Review of Systems  See scanned ROS form for detail Denies fever/chills, chest pain, SOB, headaches, numbness/tingling. No h/o complication with anesthesia, bleeding disorders or blood clots See HPI.    Vital Signs  Wt 188.8, Ht 64, BMI 32.40, Temp 97.8, Pulse sitting 75, BP sitting 112/82.    Physical Examination  Chaperone present:          Chaperone present  [facility] 06/07/2019 09:38:16 AM > , for pelvic exam.   GENERAL:          Patient appears  alert and oriented.          General Appearance:   well-appearing, well-developed, no acute distress.          Speech:  clear.   LUNGS:          Auscultation:  no wheezing/rhonchi/rales. CTA bilaterally.          Effort:  no respiratory distress, comfortable breathing.   HEART:          Heart sounds:  normal. RRR. no murmur.   ABDOMEN:          General:  soft nontender, nondistended, no masses , no masses tenderness or organomegaly, non distended.   FEMALE GENITOURINARY:          Cervix  visualized, healthy appearing, no discharge, no lesions, anterior.          Adnexa:  no mass, non tender.          Uterus:  normal size/shape/consistency, freely mobile, non tender.          Vagina:  pink/moist mucosa, no lesions, no abnormal discharge.          Vulva:  normal, no lesions, no skin discoloration.          Anus:  no external hemosrhoids.   EXTREMITIES:          general  no edema.          General:  No edema or calf  tenderness.       Pt aware of scribe services today.    Assessments    1. Encounter for other preprocedural examination - Z01.818 (Primary)  2. 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 Previous colpo x 2 have been negative.  3. Papanicolaou smear of cervix with positive high risk human papilloma virus (HPV) test - R87.810      Treatment  1. Encounter for other preprocedural examination   Notes: Pt counseled on R/B/A of procedure, including but not limited to infection, bleeding and injury to organs in the abdomen. Pt counseled on procedures for IUD insertion, discussed benefits and side effects of Mirena IUD. Plan to f/u 2 weeks postop.         Procedures  Scribe Documentation:          Attestation:  I personally scribed for Dr. Simona Huh on the date of this appointment. Electronically signed by scribe , Onnie Boer, Deborra Medina 06/07/2019 09:25:58 AM > .    Follow Up  2 Weeks post op

## 2019-06-15 ENCOUNTER — Encounter (HOSPITAL_BASED_OUTPATIENT_CLINIC_OR_DEPARTMENT_OTHER)
Admission: RE | Disposition: A | Discharge status: Home / Self Care | Payer: Self-pay | Source: Home / Self Care | Attending: Obstetrics and Gynecology

## 2019-06-15 ENCOUNTER — Encounter (HOSPITAL_BASED_OUTPATIENT_CLINIC_OR_DEPARTMENT_OTHER): Payer: Self-pay

## 2019-06-15 ENCOUNTER — Ambulatory Visit (HOSPITAL_BASED_OUTPATIENT_CLINIC_OR_DEPARTMENT_OTHER): Payer: 59 | Admitting: Anesthesiology

## 2019-06-15 ENCOUNTER — Ambulatory Visit (HOSPITAL_BASED_OUTPATIENT_CLINIC_OR_DEPARTMENT_OTHER)
Admission: RE | Admit: 2019-06-15 | Discharge: 2019-06-15 | Disposition: A | Discharge status: Home / Self Care | Payer: 59 | Attending: Obstetrics and Gynecology | Admitting: Obstetrics and Gynecology

## 2019-06-15 DIAGNOSIS — K589 Irritable bowel syndrome without diarrhea: Secondary | ICD-10-CM | POA: Diagnosis not present

## 2019-06-15 DIAGNOSIS — M1990 Unspecified osteoarthritis, unspecified site: Secondary | ICD-10-CM | POA: Insufficient documentation

## 2019-06-15 DIAGNOSIS — N920 Excessive and frequent menstruation with regular cycle: Secondary | ICD-10-CM | POA: Diagnosis present

## 2019-06-15 DIAGNOSIS — Z87891 Personal history of nicotine dependence: Secondary | ICD-10-CM | POA: Insufficient documentation

## 2019-06-15 DIAGNOSIS — R87613 High grade squamous intraepithelial lesion on cytologic smear of cervix (HGSIL): Secondary | ICD-10-CM | POA: Insufficient documentation

## 2019-06-15 DIAGNOSIS — Z882 Allergy status to sulfonamides status: Secondary | ICD-10-CM | POA: Insufficient documentation

## 2019-06-15 DIAGNOSIS — D259 Leiomyoma of uterus, unspecified: Secondary | ICD-10-CM | POA: Diagnosis not present

## 2019-06-15 HISTORY — PX: DILATATION & CURETTAGE/HYSTEROSCOPY WITH MYOSURE: SHX6511

## 2019-06-15 HISTORY — PX: INTRAUTERINE DEVICE (IUD) INSERTION: SHX5877

## 2019-06-15 HISTORY — PX: CERVICAL CONIZATION W/BX: SHX1330

## 2019-06-15 LAB — CBC
HCT: 41.7 % (ref 36.0–46.0)
Hemoglobin: 14 g/dL (ref 12.0–15.0)
MCH: 31.9 pg (ref 26.0–34.0)
MCHC: 33.6 g/dL (ref 30.0–36.0)
MCV: 95 fL (ref 80.0–100.0)
Platelets: 263 10*3/uL (ref 150–400)
RBC: 4.39 MIL/uL (ref 3.87–5.11)
RDW: 12.3 % (ref 11.5–15.5)
WBC: 7.8 10*3/uL (ref 4.0–10.5)
nRBC: 0 % (ref 0.0–0.2)

## 2019-06-15 LAB — POCT PREGNANCY, URINE: Preg Test, Ur: NEGATIVE

## 2019-06-15 SURGERY — DILATATION & CURETTAGE/HYSTEROSCOPY WITH MYOSURE
Anesthesia: General | Site: Uterus

## 2019-06-15 MED ORDER — SODIUM CHLORIDE 0.9 % IR SOLN
Status: DC | PRN
  Administered 2019-06-15 (×2): 3000 mL

## 2019-06-15 MED ORDER — FERRIC SUBSULFATE SOLN
Status: DC | PRN
  Administered 2019-06-15: 1 via TOPICAL

## 2019-06-15 MED ORDER — FENTANYL CITRATE (PF) 100 MCG/2ML IJ SOLN
INTRAMUSCULAR | Status: AC
  Filled 2019-06-15: qty 2

## 2019-06-15 MED ORDER — IODINE STRONG (LUGOLS) 5 % PO SOLN
ORAL | Status: DC | PRN
  Administered 2019-06-15: 14 mL

## 2019-06-15 MED ORDER — LIDOCAINE-EPINEPHRINE 2 %-1:100000 IJ SOLN
INTRAMUSCULAR | Status: DC | PRN
  Administered 2019-06-15: 20 mL

## 2019-06-15 MED ORDER — IBUPROFEN 600 MG PO TABS: 600.0000 mg | ORAL_TABLET | Freq: Four times a day (QID) | ORAL | 0 refills | Status: DC | PRN

## 2019-06-15 MED ORDER — DEXAMETHASONE SODIUM PHOSPHATE 10 MG/ML IJ SOLN
INTRAMUSCULAR | Status: AC
  Filled 2019-06-15: qty 1

## 2019-06-15 MED ORDER — GLYCOPYRROLATE PF 0.2 MG/ML IJ SOSY
PREFILLED_SYRINGE | INTRAMUSCULAR | Status: AC
  Filled 2019-06-15: qty 1

## 2019-06-15 MED ORDER — DEXAMETHASONE SODIUM PHOSPHATE 10 MG/ML IJ SOLN
INTRAMUSCULAR | Status: DC | PRN
  Administered 2019-06-15: 10 mg via INTRAVENOUS

## 2019-06-15 MED ORDER — PROPOFOL 10 MG/ML IV BOLUS
INTRAVENOUS | Status: AC
  Filled 2019-06-15: qty 20

## 2019-06-15 MED ORDER — ONDANSETRON HCL 4 MG/2ML IJ SOLN
INTRAMUSCULAR | Status: DC | PRN
  Administered 2019-06-15: 4 mg via INTRAVENOUS

## 2019-06-15 MED ORDER — LIDOCAINE 2% (20 MG/ML) 5 ML SYRINGE
INTRAMUSCULAR | Status: DC | PRN
  Administered 2019-06-15: 100 mg via INTRAVENOUS

## 2019-06-15 MED ORDER — FENTANYL CITRATE (PF) 100 MCG/2ML IJ SOLN
INTRAMUSCULAR | Status: DC | PRN
  Administered 2019-06-15: 50 ug via INTRAVENOUS

## 2019-06-15 MED ORDER — ACETAMINOPHEN 500 MG PO TABS
1000.0000 mg | ORAL_TABLET | Freq: Once | ORAL | Status: AC
  Administered 2019-06-15: 1000 mg via ORAL
  Filled 2019-06-15: qty 2

## 2019-06-15 MED ORDER — EPHEDRINE SULFATE-NACL 50-0.9 MG/10ML-% IV SOSY
PREFILLED_SYRINGE | INTRAVENOUS | Status: DC | PRN
  Administered 2019-06-15: 15 mg via INTRAVENOUS
  Administered 2019-06-15: 10 mg via INTRAVENOUS

## 2019-06-15 MED ORDER — KETOROLAC TROMETHAMINE 30 MG/ML IJ SOLN
INTRAMUSCULAR | Status: AC
  Filled 2019-06-15: qty 1

## 2019-06-15 MED ORDER — MIDAZOLAM HCL 2 MG/2ML IJ SOLN
INTRAMUSCULAR | Status: DC | PRN
  Administered 2019-06-15: 2 mg via INTRAVENOUS

## 2019-06-15 MED ORDER — ONDANSETRON HCL 4 MG/2ML IJ SOLN
INTRAMUSCULAR | Status: AC
  Filled 2019-06-15: qty 2

## 2019-06-15 MED ORDER — EPHEDRINE 5 MG/ML INJ
INTRAVENOUS | Status: AC
  Filled 2019-06-15: qty 10

## 2019-06-15 MED ORDER — LIDOCAINE 2% (20 MG/ML) 5 ML SYRINGE
INTRAMUSCULAR | Status: AC
  Filled 2019-06-15: qty 5

## 2019-06-15 MED ORDER — MIDAZOLAM HCL 2 MG/2ML IJ SOLN
INTRAMUSCULAR | Status: AC
  Filled 2019-06-15: qty 2

## 2019-06-15 MED ORDER — PROPOFOL 10 MG/ML IV BOLUS
INTRAVENOUS | Status: DC | PRN
  Administered 2019-06-15: 200 mg via INTRAVENOUS

## 2019-06-15 MED ORDER — LEVONORGESTREL 19.5 MCG/DAY IU IUD
INTRAUTERINE_SYSTEM | INTRAUTERINE | Status: DC | PRN
  Administered 2019-06-15: 1 via INTRAUTERINE

## 2019-06-15 MED ORDER — KETOROLAC TROMETHAMINE 30 MG/ML IJ SOLN
INTRAMUSCULAR | Status: DC | PRN
  Administered 2019-06-15: 30 mg via INTRAVENOUS

## 2019-06-15 MED ORDER — GLYCOPYRROLATE PF 0.2 MG/ML IJ SOSY
PREFILLED_SYRINGE | INTRAMUSCULAR | Status: DC | PRN
  Administered 2019-06-15: .2 mg via INTRAVENOUS

## 2019-06-15 MED ORDER — LACTATED RINGERS IV SOLN
INTRAVENOUS | Status: DC
  Administered 2019-06-15 (×2): via INTRAVENOUS
  Filled 2019-06-15: qty 1000

## 2019-06-15 SURGICAL SUPPLY — 40 items
APPLICATOR COTTON TIP 6 STRL (MISCELLANEOUS) IMPLANT
APPLICATOR COTTON TIP 6IN STRL (MISCELLANEOUS)
BLADE SURG 11 STRL SS (BLADE) ×6 IMPLANT
CATH ROBINSON RED A/P 16FR (CATHETERS) ×3 IMPLANT
CLOTH BEACON ORANGE TIMEOUT ST (SAFETY) ×3 IMPLANT
COVER WAND RF STERILE (DRAPES) ×3 IMPLANT
DEVICE MYOSURE LITE (MISCELLANEOUS) IMPLANT
DEVICE MYOSURE REACH (MISCELLANEOUS) ×3 IMPLANT
DILATOR CANAL MILEX (MISCELLANEOUS) ×3 IMPLANT
ELECT BALL LEEP 5MM RED (ELECTRODE) ×3 IMPLANT
GLOVE BIOGEL PI IND STRL 7.0 (GLOVE) ×2 IMPLANT
GLOVE BIOGEL PI INDICATOR 7.0 (GLOVE) ×4
GLOVE SURG SS PI 7.0 STRL IVOR (GLOVE) ×3 IMPLANT
GOWN STRL REUS W/ TWL XL LVL3 (GOWN DISPOSABLE) ×2 IMPLANT
GOWN STRL REUS W/TWL LRG LVL3 (GOWN DISPOSABLE) ×6 IMPLANT
GOWN STRL REUS W/TWL XL LVL3 (GOWN DISPOSABLE) ×4
HEMOSTAT ARISTA ABSORB 3G PWDR (HEMOSTASIS) IMPLANT
HEMOSTAT SURGICEL 4X8 (HEMOSTASIS) IMPLANT
IV NS IRRIG 3000ML ARTHROMATIC (IV SOLUTION) ×6 IMPLANT
KIT PROCEDURE FLUENT (KITS) ×3 IMPLANT
KIT TURNOVER CYSTO (KITS) ×3 IMPLANT
MIRENA LEVONORGESTREL-RELEASING INTRAUTERINE DEVIC ×3 IMPLANT
MYOSURE XL FIBROID (MISCELLANEOUS)
NS IRRIG 500ML POUR BTL (IV SOLUTION) ×3 IMPLANT
PACK VAGINAL WOMENS (CUSTOM PROCEDURE TRAY) ×3 IMPLANT
PAD OB MATERNITY 4.3X12.25 (PERSONAL CARE ITEMS) ×3 IMPLANT
PAD PREP 24X48 CUFFED NSTRL (MISCELLANEOUS) ×3 IMPLANT
SCOPETTES 8  STERILE (MISCELLANEOUS) ×2
SCOPETTES 8 STERILE (MISCELLANEOUS) ×1 IMPLANT
SEAL CERVICAL OMNI LOK (ABLATOR) IMPLANT
SEAL ROD LENS SCOPE MYOSURE (ABLATOR) ×3 IMPLANT
SPONGE SURGIFOAM ABS GEL 12-7 (HEMOSTASIS) IMPLANT
SURGILUBE 2OZ TUBE FLIPTOP (MISCELLANEOUS) ×3 IMPLANT
SUT VIC AB 0 CT1 27 (SUTURE) ×4
SUT VIC AB 0 CT1 27XBRD ANBCTR (SUTURE) ×2 IMPLANT
SUT VIC AB 3-0 CT1 27 (SUTURE) ×2
SUT VIC AB 3-0 CT1 TAPERPNT 27 (SUTURE) ×1 IMPLANT
SYSTEM TISS REMOVAL MYOSURE XL (MISCELLANEOUS) IMPLANT
TOWEL OR 17X26 10 PK STRL BLUE (TOWEL DISPOSABLE) ×6 IMPLANT
WATER STERILE IRR 500ML POUR (IV SOLUTION) ×3 IMPLANT

## 2019-06-15 NOTE — Interval H&P Note (Signed)
History and Physical Interval Note:  06/15/2019 12:40 PM  Kristen Morgan  has presented today for surgery, with the diagnosis of Menorrhagia with regular cycle, HGSIL on cytologic smear of cervix.  The various methods of treatment have been discussed with the patient and family. After consideration of risks, benefits and other options for treatment, the patient has consented to  Procedure(s): DILATATION & CURETTAGE/HYSTEROSCOPY WITH MYOSURE (N/A) CONIZATION CERVIX WITH BIOPSY (N/A) INTRAUTERINE DEVICE (IUD) INSERTION (N/A) as a surgical intervention.  The patient's history has been reviewed, patient examined, no change in status, stable for surgery.  I have reviewed the patient's chart and labs.  Questions were answered to the patient's satisfaction.     [name]

## 2019-06-15 NOTE — Discharge Instructions (Addendum)
Cervical Conization  Cervical conization (cone biopsy) is a procedure in which a cone-shaped portion of the cervix is cut out so that it can be examined under a microscope. The procedure is done to check for cancer cells or cells that might turn into cancer (precancerous cells). You may have this procedure if:  You have abnormal bleeding from your cervix.  You had an abnormal Pap test.  Something abnormal was seen on your cervix during an exam.  This procedure is performed in either a health care provider's office or in an operating room.  Tell a health care provider about:  Any allergies you have.  All medicines you are taking, including vitamins, herbs, eye drops, creams, and over-the-counter medicines.  Any problems you or family members have had with the use of anesthetic medicines.  Any blood disorders you have.  Any surgeries you have had.  Any medical conditions you have.  Your smoking habits.  When you normally have your period.  Whether you are pregnant or may be pregnant.  What are the risks?  Generally, this is a safe procedure. However, problems may occur, including:  Heavy bleeding for several days or weeks after the procedure.  Allergic reactions to medicines or dyes.  Increased risk of preterm labor in future pregnancies.  Infection (rare).  Damage to the cervix or other structures or organs (rare).  What happens before the procedure?  Staying hydrated  Follow instructions from your health care provider about hydration, which may include:  Up to 2 hours before the procedure - you may continue to drink clear liquids, such as water, clear fruit juice, black coffee, and plain tea.  Eating and drinking restrictions  Follow instructions from your health care provider about eating and drinking, which may include:  8 hours before the procedure - stop eating heavy meals or foods such as meat, fried foods, or fatty foods.  6 hours before the procedure - stop eating light meals or foods, such as toast or  cereal.  6 hours before the procedure - stop drinking milk or drinks that contain milk.  2 hours before the procedure - stop drinking clear liquids.  General instructions  Do not douche, have sex, use tampons, or use any vaginal medicines before the procedure as told by your health care provider.  You may be asked to empty your bladder and bowel right before the procedure.  Ask your health care provider about:  Changing or stopping your normal medicines. This is important if you take diabetes medicines or blood thinners.  Taking medicines such as aspirin and ibuprofen. These medicines can thin your blood. Do not take these medicines before your procedure if your doctor tells you not to.  Plan to have someone take you home from the hospital or clinic.  What happens during the procedure?  To reduce your risk of infection:  Your health care team will wash or sanitize their hands.  Your skin will be washed with soap.  Hair may be removed from the surgical area.  You will undress from the waist down and be given a gown to wear.  You will lie on an examining table and put your feet in stirrups.  An IV tube will be inserted into one of your veins.  You will be given one or more of the following:  A medicine to help you relax (sedative).  A medicine to numb the area (local anesthetic).  A medicine to make you fall asleep (general anesthetic).  A   a speculum will be inserted into your vagina. It will be used to spread open the walls of the vagina so your health care provider can see the inside of the vagina and cervix better.  An instrument that has a magnifying lens and a light (colposcope) will let your health care provider examine the cervix more closely.  Your health care provider will apply a solution to your cervix. This turns abnormal areas a pale color.  A tissue  sample will be removed from the cervix using one of the following methods: ? The cold knife method. In this method, the tissue is cut out with a knife (scalpel). ? The loop electrosurgical excision procedure (LEEP) method. In this method, the tissue is cut out with a thin wire that can burn (cauterize) the tissue with an electrical current. ? Laser treatment method. In this method, the tissue is cut out and then cauterized with a laser beam to prevent bleeding.  Your health care provider will apply a paste over the biopsy areas to help control bleeding.  The tissue sample will be examined under a microscope. The procedure may vary among health care providers and hospitals. What happens after the procedure?  Your blood pressure, heart rate, breathing rate, and blood oxygen level will be monitored often until the medicines you were given have worn off.  If you were given a local anesthetic, you will rest at the clinic or hospital until you are stable and feel ready to go home.  If you were given a general anesthetic, you may be monitored for a longer period of time.  You may have some cramping.  You may have bloody discharge or light to moderate bleeding.  You may have dark discharge coming from your vagina. This is from the paste used on the cervix to prevent bleeding. Summary  Cervical conization is a procedure in which a cone-shaped portion of the cervix is cut out so that it can be examined under a microscope.  The procedure is done to check for cancer cells or cells that might turn into cancer (precancerous cells). This information is not intended to replace advice given to you by your health care provider. Make sure you discuss any questions you have with your health care provider. Document Released: 04/02/2005 Document Revised: 06/05/2017 Document Reviewed: 06/25/2016 Elsevier Patient Education  [place].      Dilation and Curettage or Vacuum Curettage, Care  After This sheet gives you information about how to care for yourself after your procedure. Your health care provider may also give you more specific instructions. If you have problems or questions, contact your health care provider. What can I expect after the procedure? After your procedure, it is common to have:  Mild pain or cramping.  Some vaginal bleeding or spotting. These may last for up to 2 weeks after your procedure. Follow these instructions at home: Activity   Do not drive or use heavy machinery while taking prescription pain medicine.  Avoid driving for the first 24 hours after your procedure.  Take frequent, short walks, followed by rest periods, throughout the day. Ask your health care provider what activities are safe for you. After 1-2 days, you may be able to return to your normal activities.  Do not lift anything heavier than 10 lb (4.5 kg) until your health care provider approves.  For at least 2 weeks, or as long as told by your health care provider, do not: ? Douche. ? Use tampons. ? Have  sexual intercourse. General instructions   Take over-the-counter and prescription medicines only as told by your health care provider. This is especially important if you take blood thinning medicine.  Do not take baths, swim, or use a hot tub until your health care provider approves. Take showers instead of baths.  Wear compression stockings as told by your health care provider. These stockings help to prevent blood clots and reduce swelling in your legs.  It is your responsibility to get the results of your procedure. Ask your health care provider, or the department performing the procedure, when your results will be ready.  Keep all follow-up visits as told by your health care provider. This is important. Contact a health care provider if:  You have severe cramps that get worse or that do not get better with medicine.  You have severe abdominal pain.  You cannot  drink fluids without vomiting.  You develop pain in a different area of your pelvis.  You have bad-smelling vaginal discharge.  You have a rash. Get help right away if:  You have vaginal bleeding that soaks more than one sanitary pad in 1 hour, for 2 hours in a row.  You pass large blood clots from your vagina.  You have a fever that is above 100.55F (38.0C).  Your abdomen feels very tender or hard.  You have chest pain.  You have shortness of breath.  You cough up blood.  You feel dizzy or light-headed.  You faint.  You have pain in your neck or shoulder area. This information is not intended to replace advice given to you by your health care provider. Make sure you discuss any questions you have with your health care provider. Document Released: 06/20/2000 Document Revised: 06/05/2017 Document Reviewed: 01/24/2016 Elsevier Patient Education  2020 [place], ALEVE, MOTRIN, IBUPROFEN UNTIL8 PM TONIGHT

## 2019-06-15 NOTE — Anesthesia Procedure Notes (Signed)
Procedure Name: LMA Insertion Date/Time: 06/15/2019 12:58 PM Performed by: Mechele Claude, CRNA Pre-anesthesia Checklist: Patient identified, Emergency Drugs available, Suction available and Patient being monitored Patient Re-evaluated:Patient Re-evaluated prior to induction Oxygen Delivery Method: Circle system utilized Preoxygenation: Pre-oxygenation with 100% oxygen Induction Type: IV induction Ventilation: Mask ventilation without difficulty LMA: LMA inserted LMA Size: 4.0 Number of attempts: 1 Airway Equipment and Method: Bite block Placement Confirmation: positive ETCO2 Tube secured with: Tape Dental Injury: Teeth and Oropharynx as per pre-operative assessment

## 2019-06-15 NOTE — Anesthesia Preprocedure Evaluation (Addendum)
Anesthesia Evaluation  Patient identified by MRN, date of birth, ID band Patient awake    Reviewed: Allergy & Precautions, NPO status , Patient's Chart, lab work & pertinent test results  Airway Mallampati: II  TM Distance: >3 FB Neck ROM: Full    Dental no notable dental hx. (+) Teeth Intact, Dental Advisory Given   Pulmonary neg pulmonary ROS, former smoker,    Pulmonary exam normal breath sounds clear to auscultation       Cardiovascular negative cardio ROS Normal cardiovascular exam Rhythm:Regular Rate:Normal     Neuro/Psych negative neurological ROS  negative psych ROS   GI/Hepatic negative GI ROS, Neg liver ROS,   Endo/Other  negative endocrine ROS  Renal/GU negative Renal ROS  negative genitourinary   Musculoskeletal  (+) Arthritis ,   Abdominal   Peds  Hematology negative hematology ROS (+)   Anesthesia Other Findings   Reproductive/Obstetrics                            Anesthesia Physical Anesthesia Plan  ASA: II  Anesthesia Plan: General   Post-op Pain Management:    Induction: Intravenous  PONV Risk Score and Plan: 3 and Ondansetron, Dexamethasone and Midazolam  Airway Management Planned: LMA  Additional Equipment:   Intra-op Plan:   Post-operative Plan: Extubation in OR  Informed Consent: I have reviewed the patients History and Physical, chart, labs and discussed the procedure including the risks, benefits and alternatives for the proposed anesthesia with the patient or authorized representative who has indicated his/her understanding and acceptance.     Dental advisory given  Plan Discussed with: CRNA  Anesthesia Plan Comments:         Anesthesia Quick Evaluation

## 2019-06-15 NOTE — Brief Op Note (Signed)
06/15/2019  2:42 PM  PATIENT:  Kristen Morgan  43 y.o. female  PRE-OPERATIVE DIAGNOSIS:  MENORRHAGIA, HIGH GRADE PAP SMEAR, FIBROIDS  POST-OPERATIVE DIAGNOSIS:  MENORRHAGIA, HIGH GRADE PAP SMEAR, FIBROIDS  PROCEDURE:  Procedure(s): DILATATION & CURETTAGE/HYSTEROSCOPY WITH MYOSURE (N/A) CONIZATION CERVIX WITH BIOPSY (N/A) INTRAUTERINE DEVICE (IUD) INSERTION (N/A)  SURGEON:  Surgeon(s) and Role:    [name], MD - Primary  PHYSICIAN ASSISTANT:   ASSISTANTS: Technician   ANESTHESIA:   local and general  EBL:  10 mL   BLOOD ADMINISTERED:none  DRAINS: none   LOCAL MEDICATIONS USED:  OTHER 1/1 epinephrine, 2% Lidocaine ~ 20 ml  SPECIMEN:  Source of Specimen:  Endoemetrial currettings, cone biopsy of cervix   DEVICE: Mirena IUD, Lot # [id number], Exp: Jan 2023  DISPOSITION OF SPECIMEN:  PATHOLOGY  COUNTS:  YES  TOURNIQUET:  * No tourniquets in log *  DICTATION: .Other Dictation: Dictation Number [id number]  PLAN OF CARE: Discharge to home after PACU  PATIENT DISPOSITION:  PACU - hemodynamically stable.   Delay start of Pharmacological VTE agent (>24hrs) due to surgical blood loss or risk of bleeding: not applicable

## 2019-06-15 NOTE — Anesthesia Postprocedure Evaluation (Signed)
Anesthesia Post Note  Patient: Amylee Mccardle  Procedure(s) Performed: DILATATION & CURETTAGE/HYSTEROSCOPY WITH MYOSURE (N/A Uterus) CONIZATION CERVIX WITH BIOPSY (N/A Uterus) INTRAUTERINE DEVICE (IUD) INSERTION (N/A Uterus)     Patient location during evaluation: PACU Anesthesia Type: General Level of consciousness: awake and alert Pain management: pain level controlled Vital Signs Assessment: post-procedure vital signs reviewed and stable Respiratory status: spontaneous breathing, nonlabored ventilation, respiratory function stable and patient connected to nasal cannula oxygen Cardiovascular status: blood pressure returned to baseline and stable Postop Assessment: no apparent nausea or vomiting Anesthetic complications: no    Last Vitals:  Vitals:   06/15/19 1430 06/15/19 1445  BP: 114/78 117/71  Pulse: 84 90  Resp: 14 16  Temp:    SpO2: 96% 96%    Last Pain:  Vitals:   06/15/19 1515  TempSrc:   PainSc: 0-No pain                 Tuwana Kapaun L Braedyn Riggle

## 2019-06-15 NOTE — Transfer of Care (Signed)
  Last Vitals:  Vitals Value Taken Time  BP    Temp    Pulse 93 06/15/19 1403  Resp 14 06/15/19 1403  SpO2 100 % 06/15/19 1403  Vitals shown include unvalidated device data.  Last Pain:  Vitals:   06/15/19 1121  TempSrc: Oral  PainSc: 0-No pain      Patients Stated Pain Goal: 5 (06/15/19 1121)  Immediate Anesthesia Transfer of Care Note  Patient: Kristen Morgan  Procedure(s) Performed: Procedure(s) (LRB): DILATATION & CURETTAGE/HYSTEROSCOPY WITH MYOSURE (N/A) CONIZATION CERVIX WITH BIOPSY (N/A) INTRAUTERINE DEVICE (IUD) INSERTION (N/A)  Patient Location: PACU  Anesthesia Type: General  Level of Consciousness: awake, alert  and oriented  Airway & Oxygen Therapy: Patient Spontanous Breathing and Patient connected to nasal cannulaoxygen  Post-op Assessment: Report given to PACU RN and Post -op Vital signs reviewed and stable  Post vital signs: Reviewed and stable  Complications: No apparent anesthesia complications

## 2019-06-16 NOTE — Op Note (Signed)
NAME: Kristen Morgan, BRATTEN [facility] MEDICAL RECORD [id number] ACCOUNT [id number] DATE OF BIRTH:1976/05/20 FACILITY: [facility] LOCATION: [place], [place]  OPERATIVE REPORT  DATE OF PROCEDURE:  06/15/2019  PREOPERATIVE DIAGNOSES:   1.  Menorrhagia. 2.  Pap colposcopy discrepancy (high-grade Pap smear) and fibroids.  POSTOPERATIVE DIAGNOSES:   1.  Menorrhagia. 2.  Pap colposcopy discrepancy (high-grade Pap smear) and fibroids. 3.  Possible endometrial polyps.  PROCEDURES:   1.  Hysteroscopy. 2.  Dilatation and curettage. 3.  MyoSure conization of the cervix.   4.  Cold knife cone biopsy of the cervix. 5.  Mirena intrauterine device insertion.  SURGEON:  [name], MD  ASSISTANT:  Technician.  ANESTHESIA:  General.  ESTIMATED BLOOD LOSS:  10 mL.  DEFICIT:  Normal.  BLOOD ADMINISTERED:  None.  DRAINS:  None.  LOCAL:  1:1 epinephrine, 2% lidocaine.  SPECIMEN:  Endometrial curettings ____ and endometrial curettings, 3, and then cone biopsy of cervix with suture at 12 o'clock.  DEVICES:  Mirena IUD with lot number of [id number], expiration 07/2021.  DISPOSITION OF SPECIMEN:  To pathology.  PATIENT DISPOSITION:  To PACU, hemodynamically stable.  COMPLICATIONS:  None.  FINDINGS:  Polypoid appearing, beefy red endometrium.  No discrete lesions noted.  No fibroids . Suspected possible polyps.  Normal shape of the endometrial cavity.  The cervical canal appears normal without any masses or any abnormal bleeding.   Shortened cervix due to a history of previous LEEP and scarring on the right hand side of the cervix.  Normal vagina.  DESCRIPTION OF PROCEDURE:  The patient was identified in the holding area.  She was then taken to the operating room with IV running.  She underwent general anesthesia without complication.  She was then placed in the dorsal lithotomy position.  She was  prepped and draped in a normal sterile fashion.  A timeout was performed.   SCDs were on her legs and operating.  The Graves speculum was inserted into the vagina.  The anesthetic was injected at the anterior lip of the cervix.  Single-tooth tenaculum was then applied.  Cervix was then dilated up to a 19 Pratt.  We could not put the camera on the head of the scope,  so we waited a few minutes to get a new camera.  While we were waiting, os finder was used and the cervix was dilated.  The hysteroscope was then advanced through the cervix.  There was some scar tissue, but I entered atraumatically.  The findings above  were noted.  The REACH NovaSure was then advanced to assess and removed the fluffy polypoid endometrium, which was done without complication and adequately.  Once the guided curettage was performed, the instruments were removed from the cervix and a  sharp curettage of the uterus was performed and the specimen was collected.  Attention was then turned to the cervix.  Lugol's was applied, which was equally distributed.  No decreased uptake was noted.  The cervical os was marked with the os finder and the cervix was incised circumferentially with the 11 blade scalpel.  There  was not a lot of length of the cervix left due to the patient's history of a LEEP.  With the sound, I determined that there was only approximately maybe 2-3 cm of remaining length of the cervix, maybe 2 cm.  So with that 11 blade, I was able to make a  cone portion of biopsy of the cervix.  I needed the Allis clamp for countertraction on  the right hand side because the tissue was really tough and firm.  The specimen was removed.  However, prior to amputating the specimen, the 12 o'clock position was  tagged with a suture.  The cone portion was then removed.  Cautery was performed in the LEEP bed for hemostasis.  The sound was then used to measure the uterine cavity, which was 8 cm.   The sound on that was placed to 8 cm, then the IUD was inserted  atraumatically without injury and the strings were  trimmed.  After that the Monsel solution was then placed into the bed of the cone and hemostasis was achieved.  All instrument, sponge and needle counts were correct x3.  The D and C was performed with a Graves speculum, but we required a weighted speculum and a Deaver to do the cone biopsy.  All instruments, sponges and needle counts were correct x3.  The patient  tolerated the procedure well.  She was transferred to the recovery room in stable condition.  VN/NUANCE  D:06/15/2019 T:06/15/2019 JOB:[id number]

## 2019-06-17 LAB — SURGICAL PATHOLOGY

## 2019-08-08 ENCOUNTER — Other Ambulatory Visit: Payer: Self-pay | Admitting: Family Medicine

## 2019-08-08 DIAGNOSIS — Z1231 Encounter for screening mammogram for malignant neoplasm of breast: Secondary | ICD-10-CM

## 2019-09-16 ENCOUNTER — Other Ambulatory Visit: Payer: Self-pay

## 2019-09-16 ENCOUNTER — Ambulatory Visit
Admission: RE | Admit: 2019-09-16 | Discharge: 2019-09-16 | Disposition: A | Discharge status: Home / Self Care | Payer: 59 | Source: Ambulatory Visit | Attending: Family Medicine | Admitting: Family Medicine

## 2019-09-16 DIAGNOSIS — Z1231 Encounter for screening mammogram for malignant neoplasm of breast: Secondary | ICD-10-CM

## 2020-11-13 ENCOUNTER — Other Ambulatory Visit: Payer: Self-pay | Admitting: Family Medicine

## 2020-11-13 DIAGNOSIS — Z1231 Encounter for screening mammogram for malignant neoplasm of breast: Secondary | ICD-10-CM

## 2020-11-15 ENCOUNTER — Ambulatory Visit
Admission: RE | Admit: 2020-11-15 | Discharge: 2020-11-15 | Disposition: A | Discharge status: Home / Self Care | Payer: 59 | Source: Ambulatory Visit

## 2020-11-15 ENCOUNTER — Other Ambulatory Visit: Payer: Self-pay

## 2020-11-15 DIAGNOSIS — Z1231 Encounter for screening mammogram for malignant neoplasm of breast: Secondary | ICD-10-CM

## 2021-01-03 ENCOUNTER — Encounter: Payer: Self-pay | Admitting: Podiatry

## 2021-01-03 ENCOUNTER — Ambulatory Visit: Payer: 59 | Admitting: Podiatry

## 2021-01-03 ENCOUNTER — Other Ambulatory Visit: Payer: Self-pay

## 2021-01-03 DIAGNOSIS — L600 Ingrowing nail: Secondary | ICD-10-CM | POA: Diagnosis not present

## 2021-01-03 DIAGNOSIS — M722 Plantar fascial fibromatosis: Secondary | ICD-10-CM

## 2021-01-03 NOTE — Progress Notes (Signed)
  Subjective:  Patient ID: Kristen Morgan, female    DOB: [birth year],  MRN: [id number]  Chief Complaint  Patient presents with   Nail Problem      np-possible ingrown toenails on both big toes. One is starting to curve. It doesn't appear to be infected, but it is digging in and causing pain    45 y.o. female presents with the above complaint. History confirmed with patient.  The left side is much more incurvated, she has damaged his nail plate multiple times previously.  She also has small nodular masses in the plantar fascia which are tender  Objective:  Physical Exam: warm, good capillary refill, no trophic changes or ulcerative lesions, normal DP and PT pulses, and normal sensory exam.  Ingrowing medial nail border without paronychia left hallux, minimally tender today.  Multiple small nodular masses in the plantar fascia bilaterally approximately 1 to 2 cm in diameter.  Assessment:   1. Ingrowing left great toenail   2. Plantar fibromatosis      Plan:  Patient was evaluated and treated and all questions answered.  Discussed etiology and treatment for both ingrown toenails including partial temporary and permanent or total permanent nail avulsion.  Its only intermittently bothersome.  If it is worsening she will return to me for permanent partial nail avulsion.  We also discussed the etiology and treatment options for plantar fibromas and fibromatosis.  She has no lesions in her hands currently.  They are intermittently tender.  If they begin to worsen or increase in size she will monitor this and let me know and we will try first with corticosteroid injection if necessary.  Return as needed.   No follow-ups on file.

## 2021-01-17 ENCOUNTER — Encounter: Payer: Self-pay | Admitting: Gastroenterology

## 2021-06-18 ENCOUNTER — Encounter: Payer: Self-pay | Admitting: Physician Assistant

## 2021-06-18 ENCOUNTER — Ambulatory Visit: Payer: 59 | Admitting: Physician Assistant

## 2021-06-18 VITALS — BP 146/88 | HR 70 | Ht 64.0 in | Wt 166.0 lb

## 2021-06-18 DIAGNOSIS — Z86010 Personal history of colonic polyps: Secondary | ICD-10-CM | POA: Diagnosis not present

## 2021-06-18 DIAGNOSIS — K5909 Other constipation: Secondary | ICD-10-CM

## 2021-06-18 MED ORDER — NA SULFATE-K SULFATE-MG SULF 17.5-3.13-1.6 GM/177ML PO SOLN: 1.0000 | Freq: Once | ORAL | 0 refills | Status: AC

## 2021-06-18 NOTE — Patient Instructions (Signed)
Start Miralax 1 capful daily in 8 ounces of liquid.  Call with update in 2-3 weeks if daily Miralax not helping.   You have been scheduled for a colonoscopy. Please follow written instructions given to you at your visit today.  Please pick up your prep supplies at the pharmacy within the next 1-3 days. If you use inhalers (even only as needed), please bring them with you on the day of your procedure.  If you are age 46 or older, your body mass index should be between 23-30. Your Body mass index is 28.49 kg/m. If this is out of the aforementioned range listed, please consider follow up with your Primary Care Provider.  If you are age 65 or younger, your body mass index should be between 19-25. Your Body mass index is 28.49 kg/m. If this is out of the aformentioned range listed, please consider follow up with your Primary Care Provider.   ________________________________________________________  The [place] [facility] providers would like to encourage you to use [facility] to communicate with providers for non-urgent requests or questions.  Due to long hold times on the telephone, sending your provider a message by [facility] may be a faster and more efficient way to get a response.  Please allow 48 business hours for a response.  Please remember that this is for non-urgent requests.  _______________________________________________________

## 2021-06-18 NOTE — Progress Notes (Signed)
Chief Complaint: Constipation and nausea  HPI:    Kristen Morgan is a 45 year old female with a past medical history of IBS and others listed below, known to Dr. Fuller Plan, who presents to clinic today to discuss some constipation and nausea.    08/2017 EGD and colonoscopy.  EGD normal duodenal biopsies negative for sprue.  7 TAs and SSP's were removed at colonoscopy.  Repeat recommended in 3 years.    02/23/2019 office visit with Dr. Fuller Plan and at that time discussed constipation and lower abdominal pain.  At that time recommend she begin a regular scheduled laxative.  Initially smooth move daily adjusted as necessary to twice a day or every other day and if not effective then MiraLAX twice a day to every other day.  That time discussed she would be due for repeat colonoscopy in February 2022.    Today, the patient tells me that she has continued with constipation.  Apparently previously when she was seen in clinic it was seem to come and go, but now it is her usual with a bowel movement about every 4 days and this is after taking a "gentle laxative" from [employer] on the third day.  Patient tells me that on the day before she has a bowel movement she feels very "heavy and bloated", and when she has a bowel movement she is in there for a very long time and things just "keep coming and coming".  Also right before she has a bowel movement she gets somewhat nauseous and has a generalized abdominal discomfort.  She tells me that it interferes with her day because she never knows exactly when it is going to happen and it takes her a long time so it hinders what she is doing.  Does tell me she tried a magnesium supplement on a daily basis which seemed to work for a little while but then stopped working entirely.    She has 2 boys ages 83 and 36.    Denies fever, chills, weight loss, blood in her stool or symptoms that awaken her from sleep.  Past Medical History:  Diagnosis Date   Arthritis    back/ right hip   IBS  (irritable bowel syndrome)    Thyroid disease    Tubular adenoma of colon 08/2017    Past Surgical History:  Procedure Laterality Date   CERVICAL CONIZATION W/BX N/A 06/15/2019   Procedure: CONIZATION CERVIX WITH BIOPSY;  Surgeon: [name], MD;  Location: [facility];  Service: Gynecology;  Laterality: N/A;   CESAREAN SECTION     x 2   DILATATION & CURETTAGE/HYSTEROSCOPY WITH MYOSURE N/A 06/15/2019   Procedure: [place] WITH MYOSURE;  Surgeon: [name], MD;  Location: [facility];  Service: Gynecology;  Laterality: N/A;   INTRAUTERINE DEVICE (IUD) INSERTION N/A 06/15/2019   Procedure: INTRAUTERINE DEVICE (IUD) INSERTION;  Surgeon: [name], MD;  Location: [facility];  Service: Gynecology;  Laterality: N/A;   LEEP      Current Outpatient Medications  Medication Sig Dispense Refill   liothyronine (CYTOMEL) 5 MCG tablet Take 5 mcg by mouth 2 (two) times daily.     Multiple Vitamin (MULTIVITAMIN) tablet Take 1 tablet by mouth daily.     Probiotic Product (PROBIOTIC-10 PO) Take 1 capsule by mouth daily.     SYNTHROID 50 MCG tablet Take 50 mcg by mouth daily.     Current Facility-Administered Medications  Medication Dose Route Frequency Provider Last Rate Last  Admin   0.9 %  sodium chloride infusion  500 mL Intravenous Once Ladene Artist, MD        Allergies as of 06/18/2021 - Review Complete 01/03/2021  Allergen Reaction Noted   Sulfa antibiotics Hives and Rash     Family History  Problem Relation Age of Onset   Thyroid disease Mother    Arthritis Father    Thyroid disease Maternal Grandmother    Thyroid disease Maternal Aunt    Diabetes Paternal Uncle    Colon cancer Maternal Grandfather    Diabetes Paternal Grandmother    Breast cancer Neg Hx     Social History   Socioeconomic History   Marital status: Married    Spouse name: Not on file   Number of children: 2   Years of  education: Not on file   Highest education level: Not on file  Occupational History   Not on file  Tobacco Use   Smoking status: Former    Types: Cigarettes    Quit date: 07/07/2008    Years since quitting: 12.9   Smokeless tobacco: Never  Vaping Use   Vaping Use: Never used  Substance and Sexual Activity   Alcohol use: Yes    Comment: 3 drinks/week, combination of wine, beer, and liquor   Drug use: No   Sexual activity: Yes  Other Topics Concern   Not on file  Social History Narrative   Not on file   Social Determinants of Health   Financial Resource Strain: Not on file  Food Insecurity: Not on file  Transportation Needs: Not on file  Physical Activity: Not on file  Stress: Not on file  Social Connections: Not on file  Intimate Partner Violence: Not on file    Review of Systems:    Constitutional: No weight loss, fever or chills Cardiovascular: No chest pain Respiratory: No SOB  Gastrointestinal: See HPI and otherwise negative   Physical Exam:  Vital signs: BP (!) 146/88    Pulse 70    Ht 5\' 4"  (1.626 m)    Wt 166 lb (75.3 kg)    BMI 28.49 kg/m   Constitutional:   Pleasant Caucasian female appears to be in NAD, Well developed, Well nourished, alert and cooperative Respiratory: Respirations even and unlabored. Lungs clear to auscultation bilaterally.   No wheezes, crackles, or rhonchi.  Cardiovascular: Normal S1, S2. No MRG. Regular rate and rhythm. No peripheral edema, cyanosis or pallor.  Gastrointestinal:  Soft, nondistended, nontender. No rebound or guarding. Normal bowel sounds. No appreciable masses or hepatomegaly. Rectal:  Not performed.  Psychiatric: Demonstrates good judgement and reason without abnormal affect or behaviors.  No recent labs.  Assessment: 1.  Constipation: Has become patient's constant, with a bowel movement every 4 days, has never tried a daily laxatives, has been diagnosed with IBS in the past; likely IBS-C 2.  History of adenomatous  polyps: On colonoscopy in 2019 with recommendations to repeat in 3 years  Plan: 1.  Scheduled patient for a surveillance colonoscopy in the [place] with Dr. Fuller Plan.  Did provide the patient a detailed list of risks for the procedure and she agrees to proceed.  She will have a 2-day bowel prep given history of constipation. Patient is appropriate for endoscopic procedure(s) in the ambulatory ([place]) setting.  2.  Recommend the patient start MiraLAX on a daily basis.  Recommend starting 1 dose a day and titrating from there.  Discussed titration in detail. 3.  Explained to patient that  if above protocols not helping over the next 2 to 3 weeks then she can contact me and at that point would recommend starting a prescription medicine such as low-dose Linzess at 72 mcg daily 30 minutes before meal. 4.  Patient to follow in clinic per recommendations from Dr. Fuller Plan after time of procedure.  Ellouise Newer, PA-C [facility] 06/18/2021, 11:00 AM  Cc: London Pepper, MD

## 2021-07-29 ENCOUNTER — Encounter: Payer: 59 | Admitting: Gastroenterology

## 2021-08-06 ENCOUNTER — Encounter: Payer: Self-pay | Admitting: Gastroenterology

## 2021-08-13 ENCOUNTER — Encounter: Payer: Self-pay | Admitting: Gastroenterology

## 2021-08-13 ENCOUNTER — Ambulatory Visit (AMBULATORY_SURGERY_CENTER): Payer: 59 | Admitting: Gastroenterology

## 2021-08-13 VITALS — BP 104/59 | HR 60 | Temp 98.6°F | Resp 19 | Ht 64.0 in | Wt 166.0 lb

## 2021-08-13 DIAGNOSIS — Z86010 Personal history of colonic polyps: Secondary | ICD-10-CM

## 2021-08-13 DIAGNOSIS — K635 Polyp of colon: Secondary | ICD-10-CM | POA: Diagnosis not present

## 2021-08-13 DIAGNOSIS — D123 Benign neoplasm of transverse colon: Secondary | ICD-10-CM

## 2021-08-13 MED ORDER — SODIUM CHLORIDE 0.9 % IV SOLN: 500.0000 mL | Freq: Once | INTRAVENOUS | Status: DC

## 2021-08-13 NOTE — Patient Instructions (Signed)
Thank you for allowing [place] to care for you today. Await biopsy results of polyp removed.  Will make recommendation at that time for future colonoscopy    YOU HAD AN ENDOSCOPIC PROCEDURE TODAY AT [facility]:   Refer to the procedure report that was given to you for any specific questions about what was found during the examination.  If the procedure report does not answer your questions, please call your gastroenterologist to clarify.  If you requested that your care partner not be given the details of your procedure findings, then the procedure report has been included in a sealed envelope for you to review at your convenience later.  YOU SHOULD EXPECT: Some feelings of bloating in the abdomen. Passage of more gas than usual.  Walking can help get rid of the air that was put into your GI tract during the procedure and reduce the bloating. If you had a lower endoscopy (such as a colonoscopy or flexible sigmoidoscopy) you may notice spotting of blood in your stool or on the toilet paper. If you underwent a bowel prep for your procedure, you may not have a normal bowel movement for a few days.  Please Note:  You might notice some irritation and congestion in your nose or some drainage.  This is from the oxygen used during your procedure.  There is no need for concern and it should clear up in a day or so.  SYMPTOMS TO REPORT IMMEDIATELY:  Following lower endoscopy (colonoscopy or flexible sigmoidoscopy):  Excessive amounts of blood in the stool  Significant tenderness or worsening of abdominal pains  Swelling of the abdomen that is new, acute  Fever of 100F or higher   For urgent or emergent issues, a gastroenterologist can be reached at any hour by calling [phone]. Do not use MyChart messaging for urgent concerns.    DIET:  We do recommend a small meal at first, but then you may proceed to your regular diet.  Drink plenty of fluids but you should avoid alcoholic  beverages for 24 hours.  ACTIVITY:  You should plan to take it easy for the rest of today and you should NOT DRIVE or use heavy machinery until tomorrow (because of the sedation medicines used during the test).    FOLLOW UP: Our staff will call the number listed on your records 48-72 hours following your procedure to check on you and address any questions or concerns that you may have regarding the information given to you following your procedure. If we do not reach you, we will leave a message.  We will attempt to reach you two times.  During this call, we will ask if you have developed any symptoms of COVID 19. If you develop any symptoms (ie: fever, flu-like symptoms, shortness of breath, cough etc.) before then, please call [phone].  If you test positive for Covid 19 in the 2 weeks post procedure, please call and report this information to [place].    If any biopsies were taken you will be contacted by phone or by letter within the next 1-3 weeks.  Please call us at [phone] if you have not heard about the biopsies in 3 weeks.    SIGNATURES/CONFIDENTIALITY: You and/or your care partner have signed paperwork which will be entered into your electronic medical record.  These signatures attest to the fact that that the information above on your After Visit Summary has been reviewed and is understood.  Full responsibility of the confidentiality  of this discharge information lies with you and/or your care-partner.

## 2021-08-13 NOTE — Progress Notes (Signed)
VS-DT 

## 2021-08-13 NOTE — Progress Notes (Signed)
No problems noted in the recovery room. maw 

## 2021-08-13 NOTE — Progress Notes (Signed)
History & Physical  Primary Care Physician:  London Pepper, MD Primary Gastroenterologist: Lucio Edward, MD  CHIEF COMPLAINT: Personal history of colon polyps, chronic constipation  HPI: Kristen Morgan is a 46 y.o. 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   Past Medical History:  Diagnosis Date   Arthritis    back/ right hip   IBS (irritable bowel syndrome)    Thyroid disease    Tubular adenoma of colon 08/2017    Past Surgical History:  Procedure Laterality Date   CERVICAL CONIZATION W/BX N/A 06/15/2019   Procedure: CONIZATION CERVIX WITH BIOPSY;  Surgeon: [name], MD;  Location: [facility];  Service: Gynecology;  Laterality: N/A;   CESAREAN SECTION     x 2   COLONOSCOPY     DILATATION & CURETTAGE/HYSTEROSCOPY WITH MYOSURE N/A 06/15/2019   Procedure: DILATATION & CURETTAGE/HYSTEROSCOPY WITH MYOSURE;  Surgeon: [name], MD;  Location: [facility];  Service: Gynecology;  Laterality: N/A;   INTRAUTERINE DEVICE (IUD) INSERTION N/A 06/15/2019   Procedure: INTRAUTERINE DEVICE (IUD) INSERTION;  Surgeon: [name], MD;  Location: [facility];  Service: Gynecology;  Laterality: N/A;   LEEP      Prior to Admission medications   Medication Sig Start Date End Date Taking? Authorizing Provider  liothyronine (CYTOMEL) 5 MCG tablet Take 5 mcg by mouth 2 (two) times daily. 12/04/20  Yes [provider]  Multiple Vitamin (MULTIVITAMIN) tablet Take 1 tablet by mouth daily.   Yes [provider]  Probiotic Product (PROBIOTIC-10 PO) Take 1 capsule by mouth daily.   Yes [provider]  SYNTHROID 50 MCG tablet Take 50 mcg by mouth daily. 11/10/20  Yes [provider]  Azelastine HCl 137 MCG/SPRAY SOLN 1 puff in each nostril 07/03/20   [provider]    Current Outpatient Medications   Medication Sig Dispense Refill   liothyronine (CYTOMEL) 5 MCG tablet Take 5 mcg by mouth 2 (two) times daily.     Multiple Vitamin (MULTIVITAMIN) tablet Take 1 tablet by mouth daily.     Probiotic Product (PROBIOTIC-10 PO) Take 1 capsule by mouth daily.     SYNTHROID 50 MCG tablet Take 50 mcg by mouth daily.     Azelastine HCl 137 MCG/SPRAY SOLN 1 puff in each nostril     Current Facility-Administered Medications  Medication Dose Route Frequency Provider Last Rate Last Admin   0.9 %  sodium chloride infusion  500 mL Intravenous Once Ladene Artist, MD        Allergies as of 08/13/2021 - Review Complete 08/13/2021  Allergen Reaction Noted   Sulfa antibiotics Hives and Rash     Family History  Problem Relation Age of Onset   Thyroid disease Mother    Arthritis Father    Thyroid disease Maternal Aunt    Diabetes Paternal Uncle    Thyroid disease Maternal Grandmother    Colon cancer Maternal Grandfather    Diabetes Paternal Grandmother    Breast cancer Neg Hx    Esophageal cancer Neg Hx    Rectal cancer Neg Hx    Stomach cancer Neg Hx     Social History   Socioeconomic History   Marital status: Married    Spouse name: Not on file   Number of children: 2   Years of education: Not on file   Highest education level: Not on file  Occupational History  Not on file  Tobacco Use   Smoking status: Former    Types: Cigarettes    Quit date: 07/07/2008    Years since quitting: 13.1   Smokeless tobacco: Never  Vaping Use   Vaping Use: Never used  Substance and Sexual Activity   Alcohol use: Yes    Comment: 3 drinks/week, combination of wine, beer, and liquor   Drug use: No   Sexual activity: Yes    Birth control/protection: I.U.D.  Other Topics Concern   Not on file  Social History Narrative   Not on file   Social Determinants of Health   Financial Resource Strain: Not on file  Food Insecurity: Not on file  Transportation Needs: Not on file  Physical Activity: Not  on file  Stress: Not on file  Social Connections: Not on file  Intimate Partner Violence: Not on file    Review of Systems:  All systems reviewed an negative except where noted in HPI.  Gen: Denies any fever, chills, sweats, anorexia, fatigue, weakness, malaise, weight loss, and sleep disorder CV: Denies chest pain, angina, palpitations, syncope, orthopnea, PND, peripheral edema, and claudication. Resp: Denies dyspnea at rest, dyspnea with exercise, cough, sputum, wheezing, coughing up blood, and pleurisy. GI: Denies vomiting blood, jaundice, and fecal incontinence.   Denies dysphagia or odynophagia. GU : Denies urinary burning, blood in urine, urinary frequency, urinary hesitancy, nocturnal urination, and urinary incontinence. MS: Denies joint pain, limitation of movement, and swelling, stiffness, low back pain, extremity pain. Denies muscle weakness, cramps, atrophy.  Derm: Denies rash, itching, dry skin, hives, moles, warts, or unhealing ulcers.  Psych: Denies depression, anxiety, memory loss, suicidal ideation, hallucinations, paranoia, and confusion. Heme: Denies bruising, bleeding, and enlarged lymph nodes. Neuro:  Denies any headaches, dizziness, paresthesias. Endo:  Denies any problems with DM, thyroid, adrenal function.   Physical Exam: General:  Alert, well-developed, in NAD Head:  Normocephalic and atraumatic. Eyes:  Sclera clear, no icterus.   Conjunctiva pink. Ears:  Normal auditory acuity. Mouth:  No deformity or lesions.  Neck:  Supple; no masses . Lungs:  Clear throughout to auscultation.   No wheezes, crackles, or rhonchi. No acute distress. Heart:  Regular rate and rhythm; no murmurs. Abdomen:  Soft, nondistended, nontender. No masses, hepatomegaly. No obvious masses.  Normal bowel .    Rectal:  Deferred   Msk:  Symmetrical without gross deformities.. Pulses:  Normal pulses noted. Extremities:  Without edema. Neurologic:  Alert and  oriented x4;  grossly normal  neurologically. Skin:  Intact without significant lesions or rashes. Cervical Nodes:  No significant cervical adenopathy. Psych:  Alert and cooperative. Normal mood and affect.   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  Pricilla Riffle. Fuller Plan  08/13/2021, 11:18 AM See Shea Evans, [facility], to contact our on call provider

## 2021-08-13 NOTE — Progress Notes (Signed)
Called to room to assist during endoscopic procedure.  Patient ID and intended procedure confirmed with present staff. Received instructions for my participation in the procedure from the performing physician.  

## 2021-08-13 NOTE — Op Note (Signed)
[facility] Patient Name: Kristen Morgan Procedure Date: 08/13/2021 11:21 AM MRN: [id number] Endoscopist: Ladene Artist , MD Age: 46 Referring MD:  Date of Birth: 16-Feb-1976 Gender: Female Account #: [id number] Procedure:                Colonoscopy Indications:              Surveillance: Personal history of adenomatous and                            sessile serrated polyps on last colonoscopy 3 years                            ago Medicines:                Monitored Anesthesia Care Procedure:                Pre-Anesthesia Assessment:                           - Prior to the procedure, a History and Physical                            was performed, and patient medications and                            allergies were reviewed. The patient's tolerance of                            previous anesthesia was also reviewed. The risks                            and benefits of the procedure and the sedation                            options and risks were discussed with the patient.                            All questions were answered, and informed consent                            was obtained. Prior Anticoagulants: The patient has                            taken no previous anticoagulant or antiplatelet                            agents. ASA Grade Assessment: II - A patient with                            mild systemic disease. After reviewing the risks                            and benefits, the patient was deemed in  satisfactory condition to undergo the procedure.                           After obtaining informed consent, the colonoscope                            was passed under direct vision. Throughout the                            procedure, the patient's blood pressure, pulse, and                            oxygen saturations were monitored continuously. The                            Olympus 190 was introduced through the anus and                             advanced to the the cecum, identified by                            appendiceal orifice and ileocecal valve. The                            ileocecal valve, appendiceal orifice, and rectum                            were photographed. The quality of the bowel                            preparation was good. The colonoscopy was performed                            without difficulty. The patient tolerated the                            procedure well. Scope In: 11:26:13 AM Scope Out: 11:44:50 AM Scope Withdrawal Time: 0 hours 13 minutes 46 seconds  Total Procedure Duration: 0 hours 18 minutes 37 seconds  Findings:                 The perianal and digital rectal examinations were                            normal.                           A 9 mm polyp was found in the transverse colon. The                            polyp was sessile. The polyp was removed with a                            cold snare. Resection and retrieval were complete.  The exam was otherwise without abnormality on                            direct and retroflexion views. Complications:            No immediate complications. Estimated blood loss:                            None. Estimated Blood Loss:     Estimated blood loss: none. Impression:               - One 9 mm polyp in the transverse colon, removed                            with a cold snare. Resected and retrieved.                           - The examination was otherwise normal on direct                            and retroflexion views. Recommendation:           - Repeat colonoscopy date to be determined after                            pending pathology results are reviewed for                            surveillance based on pathology results.                           - Patient has a contact number available for                            emergencies. The signs and symptoms of potential                             delayed complications were discussed with the                            patient. Return to normal activities tomorrow.                            Written discharge instructions were provided to the                            patient.                           - Resume previous diet.                           - Continue present medications.                           - Await pathology results. Ladene Artist, MD 08/13/2021 11:47:50 AM This report has been  signed electronically.

## 2021-08-15 ENCOUNTER — Telehealth: Payer: Self-pay | Admitting: *Deleted

## 2021-08-15 NOTE — Telephone Encounter (Signed)
°  Follow up Call-  Call back number 08/13/2021  Post procedure Call Back phone  # [phone]  Permission to leave phone message Yes  Some recent data might be hidden     Patient questions:  Do you have a fever, pain , or abdominal swelling? No. Pain Score  0 *  Have you tolerated food without any problems? Yes.    Have you been able to return to your normal activities? Yes.    Do you have any questions about your discharge instructions: Diet   No. Medications  No. Follow up visit  No.  Do you have questions or concerns about your Care? No.  Actions: * If pain score is 4 or above: No action needed, pain <4.

## 2021-09-01 ENCOUNTER — Encounter: Payer: Self-pay | Admitting: Gastroenterology

## 2021-09-06 ENCOUNTER — Encounter (HOSPITAL_BASED_OUTPATIENT_CLINIC_OR_DEPARTMENT_OTHER): Payer: Self-pay | Admitting: Nurse Practitioner

## 2021-09-06 ENCOUNTER — Other Ambulatory Visit: Payer: Self-pay

## 2021-09-06 ENCOUNTER — Ambulatory Visit (HOSPITAL_BASED_OUTPATIENT_CLINIC_OR_DEPARTMENT_OTHER): Payer: 59 | Admitting: Nurse Practitioner

## 2021-09-06 VITALS — BP 117/72 | HR 98 | Ht 64.0 in | Wt 174.7 lb

## 2021-09-06 DIAGNOSIS — M545 Low back pain, unspecified: Secondary | ICD-10-CM

## 2021-09-06 DIAGNOSIS — G8929 Other chronic pain: Secondary | ICD-10-CM

## 2021-09-06 DIAGNOSIS — M25551 Pain in right hip: Secondary | ICD-10-CM | POA: Diagnosis not present

## 2021-09-06 DIAGNOSIS — K581 Irritable bowel syndrome with constipation: Secondary | ICD-10-CM

## 2021-09-06 DIAGNOSIS — M79604 Pain in right leg: Secondary | ICD-10-CM

## 2021-09-06 MED ORDER — GABAPENTIN 300 MG PO CAPS: 300.0000 mg | ORAL_CAPSULE | Freq: Every day | ORAL | 3 refills | Status: DC

## 2021-09-06 MED ORDER — DICYCLOMINE HCL 10 MG PO CAPS: 10.0000 mg | ORAL_CAPSULE | Freq: Three times a day (TID) | ORAL | 11 refills | Status: DC

## 2021-09-06 NOTE — Patient Instructions (Addendum)
Thank you for choosing [place] [facility] for your Primary Care needs. I am excited for the opportunity to partner with you to meet your health care goals. It was a pleasure meeting you today!  Recommendations from today's visit: I have sent the referral to Dr. Sammuel Hines for the hip and lower back pain.  I have sent the medication in for the pain/sleep. I have sent the bentyl for stomach cramping. Please let me know if these do not help with your symptoms.   Information on diet, exercise, and health maintenance recommendations are listed below. This is information to help you be sure you are on track for optimal health and monitoring.   Please look over this and let us know if you have any questions or if you have completed any of the health maintenance outside of [place] so that we can be sure your records are up to date.  ___________________________________________________________ About Me: I am an Adult-Geriatric Nurse Practitioner with a background in caring for patients for more than 20 years with a strong intensive care background. I provide primary care and sports medicine services to patients age 30 and older within this office. My education had a strong focus on caring for the older adult population, which I am passionate about. I am also the director of the APP Fellowship with [facility].   My desire is to provide you with the best service through preventive medicine and supportive care. I consider you a part of the medical team and value your input. I work diligently to ensure that you are heard and your needs are met in a safe and effective manner. I want you to feel comfortable with me as your provider and want you to know that your health concerns are important to me.  For your information, our office hours are: Monday, Tuesday, and Thursday 8:00 AM - 5:00 PM Wednesday and Friday 8:00 AM - 12:00 PM.   In my time away from the office I am teaching new  APP's within the system and am unavailable, but my partner, Dr. Burnard Bunting is in the office for emergent needs.   If you have questions or concerns, please call our office at [phone] or send [place] a MyChart message and we will respond as quickly as possible.  ____________________________________________________________ MyChart:  For all urgent or time sensitive needs we ask that you please call the office to avoid delays. Our number is [phone]. MyChart is not constantly monitored and due to the large volume of messages a day, replies may take up to 72 business hours.  MyChart Policy: MyChart allows for you to see your visit notes, after visit summary, provider recommendations, lab and tests results, make an appointment, request refills, and contact your provider or the office for non-urgent questions or concerns. Providers are seeing patients during normal business hours and do not have built in time to review MyChart messages.  We ask that you allow a minimum of 3 business days for responses to [employer]. For this reason, please do not send urgent requests through [place]. Please call the office at [phone]. New and ongoing conditions may require a visit. We have virtual and in person visit available for your convenience.  Complex MyChart concerns may require a visit. Your provider may request you schedule a virtual or in person visit to ensure we are providing the best care possible. MyChart messages sent after 11:00 AM on Friday will not be received by the provider until  Monday morning.    Lab and Test Results: You will receive your lab and test results on MyChart as soon as they are completed and results have been sent by the lab or testing facility. Due to this service, you will receive your results BEFORE your provider.  I review lab and tests results each morning prior to seeing patients. Some results require collaboration with other providers to ensure you are receiving the  most appropriate care. For this reason, we ask that you please allow a minimum of 3-5 business days from the time the ALL results have been received for your provider to receive and review lab and test results and contact you about these.  Most lab and test result comments from the provider will be sent through [place]. Your provider may recommend changes to the plan of care, follow-up visits, repeat testing, ask questions, or request an office visit to discuss these results. You may reply directly to this message or call the office at [phone] to provide information for the provider or set up an appointment. In some instances, you will be called with test results and recommendations. Please let us know if this is preferred and we will make note of this in your chart to provide this for you.    If you have not heard a response to your lab or test results in 5 business days from all results returning to [place], please call the office to let us know. We ask that you please avoid calling prior to this time unless there is an emergent concern. Due to high call volumes, this can delay the resulting process.  After Hours: For all non-emergency after hours needs, please call the office at [phone] and select the option to reach the on-call provider service. On-call services are shared between multiple [facility] offices and therefore it will not be possible to speak directly with your provider. On-call providers may provide medical advice and recommendations, but are unable to provide refills for maintenance medications.  For all emergency or urgent medical needs after normal business hours, we recommend that you seek care at the closest Urgent Care or Emergency Department to ensure appropriate treatment in a timely manner.  [facility] [place] has a 24 hour emergency room located on the ground floor for your convenience.   Urgent Concerns During the Business Day Providers are seeing  patients from 8AM to [place] with a busy schedule and are most often not able to respond to non-urgent calls until the end of the day or the next business day. If you should have URGENT concerns during the day, please call and speak to the nurse or schedule a same day appointment so that we can address your concern without delay.   Thank you, again, for choosing me as your health care partner. I appreciate your trust and look forward to learning more about you.   Worthy Keeler, DNP, AGNP-c ___________________________________________________________  Health Maintenance Recommendations Screening Testing Mammogram Every 1 -2 years based on history and risk factors Starting at age 27 Pap Smear Ages 21-39 every 3 years Ages 52-65 every 5 years with HPV testing More frequent testing may be required based on results and history Colon Cancer Screening Every 1-10 years based on test performed, risk factors, and history Starting at age 73 Bone Density Screening Every 2-10 years based on history Starting at age 88 for women Recommendations for men differ based on medication usage, history, and risk factors AAA Screening One time ultrasound Men  61-77 years old who have every smoked Lung Cancer Screening Low Dose Lung CT every 12 months Age 69-80 years with a 30 pack-year smoking history who still smoke or who have quit within the last 15 years  Screening Labs Routine  Labs: Complete Blood Count (CBC), Complete Metabolic Panel (CMP), Cholesterol (Lipid Panel) Every 6-12 months based on history and medications May be recommended more frequently based on current conditions or previous results Hemoglobin A1c Lab Every 3-12 months based on history and previous results Starting at age 8 or earlier with diagnosis of diabetes, high cholesterol, BMI >26, and/or risk factors Frequent monitoring for patients with diabetes to ensure blood sugar control Thyroid Panel (TSH w/ T3 & T4) Every 6 months  based on history, symptoms, and risk factors May be repeated more often if on medication HIV One time testing for all patients 53 and older May be repeated more frequently for patients with increased risk factors or exposure Hepatitis C One time testing for all patients 45 and older May be repeated more frequently for patients with increased risk factors or exposure Gonorrhea, Chlamydia Every 12 months for all sexually active persons 13-24 years Additional monitoring may be recommended for those who are considered high risk or who have symptoms PSA Men 20-51 years old with risk factors Additional screening may be recommended from age 37-69 based on risk factors, symptoms, and history  Vaccine Recommendations Tetanus Booster All adults every 10 years Flu Vaccine All patients 6 months and older every year COVID Vaccine All patients 12 years and older Initial dosing with booster May recommend additional booster based on age and health history HPV Vaccine 2 doses all patients age 48-26 Dosing may be considered for patients over 26 Shingles Vaccine (Shingrix) 2 doses all adults 50 years and older Pneumonia (Pneumovax 23) All adults 33 years and older May recommend earlier dosing based on health history Pneumonia (Prevnar 16) All adults 70 years and older Dosed 1 year after Pneumovax 23  Additional Screening, Testing, and Vaccinations may be recommended on an individualized basis based on family history, health history, risk factors, and/or exposure.  __________________________________________________________  Diet Recommendations for All Patients  I recommend that all patients maintain a diet low in saturated fats, carbohydrates, and cholesterol. While this can be challenging at first, it is not impossible and small changes can make big differences.  Things to try: Decreasing the amount of soda, sweet tea, and/or juice to one or less per day and replace with water While water is  always the first choice, if you do not like water you may consider adding a water additive without sugar to improve the taste other sugar free drinks Replace potatoes with a brightly colored vegetable at dinner Use healthy oils, such as canola oil or olive oil, instead of butter or hard margarine Limit your bread intake to two pieces or less a day Replace regular pasta with low carb pasta options Bake, broil, or grill foods instead of frying Monitor portion sizes  Eat smaller, more frequent meals throughout the day instead of large meals  An important thing to remember is, if you love foods that are not great for your health, you don't have to give them up completely. Instead, allow these foods to be a reward when you have done well. Allowing yourself to still have special treats every once in a while is a nice way to tell yourself thank you for working hard to keep yourself healthy.   Also remember that every  day is a new day. If you have a bad day and "fall off the wagon", you can still climb right back up and keep moving along on your journey!  We have resources available to help you!  Some websites that may be helpful include: www.http://carter.biz/  Www.VeryWellFit.com _____________________________________________________________  Activity Recommendations for All Patients  I recommend that all adults get at least 20 minutes of moderate physical activity that elevates your heart rate at least 5 days out of the week.  Some examples include: Walking or jogging at a pace that allows you to carry on a conversation Cycling (stationary bike or outdoors) Water aerobics Yoga Weight lifting Dancing If physical limitations prevent you from putting stress on your joints, exercise in a pool or seated in a chair are excellent options.  Do determine your MAXIMUM heart rate for activity: YOUR AGE - 220 = MAX HeartRate   Remember! Do not push yourself too hard.  Start slowly and build up your pace,  speed, weight, time in exercise, etc.  Allow your body to rest between exercise and get good sleep. You will need more water than normal when you are exerting yourself. Do not wait until you are thirsty to drink. Drink with a purpose of getting in at least 8, 8 ounce glasses of water a day plus more depending on how much you exercise and sweat.    If you begin to develop dizziness, chest pain, abdominal pain, jaw pain, shortness of breath, headache, vision changes, lightheadedness, or other concerning symptoms, stop the activity and allow your body to rest. If your symptoms are severe, seek emergency evaluation immediately. If your symptoms are concerning, but not severe, please let us know so that we can recommend further evaluation.

## 2021-09-09 DIAGNOSIS — K5909 Other constipation: Secondary | ICD-10-CM | POA: Insufficient documentation

## 2021-09-09 DIAGNOSIS — M25551 Pain in right hip: Secondary | ICD-10-CM | POA: Insufficient documentation

## 2021-09-09 DIAGNOSIS — M545 Low back pain, unspecified: Secondary | ICD-10-CM | POA: Insufficient documentation

## 2021-09-09 DIAGNOSIS — M79604 Pain in right leg: Secondary | ICD-10-CM | POA: Insufficient documentation

## 2021-09-09 DIAGNOSIS — G8929 Other chronic pain: Secondary | ICD-10-CM | POA: Insufficient documentation

## 2021-09-09 DIAGNOSIS — K581 Irritable bowel syndrome with constipation: Secondary | ICD-10-CM | POA: Insufficient documentation

## 2021-09-09 HISTORY — DX: Low back pain, unspecified: M54.50

## 2021-09-09 HISTORY — DX: Other chronic pain: G89.29

## 2021-09-09 NOTE — Assessment & Plan Note (Signed)
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  ?Will send referral to Dr. Sammuel Hines for further evaluation and recommendations.  ?Will start gabapentin today and recommend NSAID use to help with pain and inflammation. Ice and rest may be helpful with gentle stretching exercises.  ?Suspect that altered gait due to suspected piriformis and low back pain have contributed to her symptoms.  ?Will follow with ortho.  ?

## 2021-09-09 NOTE — Progress Notes (Signed)
Orma Render, DNP, AGNP-c [facility] Medicine [address]   [place], [place] [address] [phone]  New patient visit   Patient: Kristen Morgan   DOB: 09/01/1975   46 y.o. Female  MRN: [id number] Visit Date: 09/06/2021  Patient Care Team: Jennene Downie, Coralee Pesa, NP as PCP - General (Nurse Practitioner)  Today's healthcare provider: Orma Render, NP   Chief Complaint  Patient presents with   New Patient (Initial Visit)    Patient presents today to establish care. She does have a couple of issues to address. She has chronic back and hip pain. She would like a referral to Dr Sammuel Hines. She has severe constipation after having 2 colonoscopies. She would like advise.    Subjective    Kristen Morgan is a 46 y.o. female who presents today as a new patient to establish care.    Patient endorses the following concerns presently: Back and Hip Pain Najmah endorses chronic low back pain for many years for which she has been seen in the past and received chiropractic care and injections for.  She endorses chiropractic care is helpful, but temporarily.  She endorses recent worsening of pain within the last year, with radiation through the right buttocks and into the later right thigh. She is also experiencing additional pain in the right trochanter area.  She reports that injections have not been completely beneficial for her recently and she would like to know other options that may help this.  She endorses tenderness and pain when laying on the right side in the trochanter. She tells me she feels a "lump" in the skin over the right hip and feels like her hip is moving forward more than the left.  She reports a pulling sensation in the right hip and buttocks when crossing her legs.  She reports that last week she sat down on her cough and experienced a sharp shooting pain in the left thigh. This has resolved.  She reports that she stands for work and  feels fine with standing, but sitting exacerbates her pain. She has been told that her facet joints are inflamed in the past.  She reports imaging has been completed, but this has been at least 5 years ago.  She tells me that she did try PT very Jalie Eiland on in her pain, but this was only temporary and she does not recall if it was helpful.  She denies weakness or falls associated with her pain unless all of her weight is on the right leg. She has no bowel of bladder changes and her sensation in the buttocks, hip, and leg remains intact.   Constipation She endorses long term bowel concerns. She tells me initially her symptoms alternated between constipation and loose stools. She reports that in the recent years, this has transitioned into more constipation symptoms. She reports intermittent sharp, cramping abdominal pain with the urge to deficate. When she goes to the bathroom she is unable to have a bowel movement for a long period of time. She tells me that with the cramping she will also experience nausea. She tells me that when she is finally able to have a bowel movement the stool is soft, formed, and snake-like. She denies hard or painful to pass stool.  She has recently had a colonoscopy and it showed the presence of a polyp, which was removed, but no other concerning findings present. She has also had an endoscopy in the past.  She has followed the [employer] diet and reports that she felt it helped initially, but then her bowel habits went back to normal. She has kept a food log with no distinct association in foods noted contributing to symptoms.  She endorses feeling gassy and bloated most days. She has started taking a probiotic to help with her symptoms, but is not sure this is helping yet. She has also recently starting taking magnesium at bedtime. She does report that her cramping and nausea are not as bad since starting this.  She reports that the symptoms do interfere with her daily life and cause  her to spend a lot of time in the bathroom with unsuccessful relief. She has never been on bentyl or other medications for the cramping.  She tells me that GI and her previous PCP have recommended miralax, which she has been taking twice a day with no change.  She denies emesis, blood stools, hemorrhoids, hard stools.   History reviewed and reveals the following: Past Medical History:  Diagnosis Date   Arthritis    back/ right hip   IBS (irritable bowel syndrome)    Thyroid disease    Tubular adenoma of colon 08/2017   Past Surgical History:  Procedure Laterality Date   CERVICAL CONIZATION W/BX N/A 06/15/2019   Procedure: CONIZATION CERVIX WITH BIOPSY;  Surgeon: [name], MD;  Location: [facility];  Service: Gynecology;  Laterality: N/A;   CESAREAN SECTION     x 2   COLONOSCOPY     DILATATION & CURETTAGE/HYSTEROSCOPY WITH MYOSURE N/A 06/15/2019   Procedure: DILATATION & CURETTAGE/HYSTEROSCOPY WITH MYOSURE;  Surgeon: [name], MD;  Location: [facility];  Service: Gynecology;  Laterality: N/A;   INTRAUTERINE DEVICE (IUD) INSERTION N/A 06/15/2019   Procedure: INTRAUTERINE DEVICE (IUD) INSERTION;  Surgeon: [name], MD;  Location: [facility];  Service: Gynecology;  Laterality: N/A;   LEEP     Family Status  Relation Name Status   Mother  (Not Specified)   Father  (Not Specified)   Mat Aunt  (Not Specified)   Annamarie Major  (Not Specified)   MGM  (Not Specified)   MGF  (Not Specified)   PGM  (Not Specified)   Neg Hx  (Not Specified)   Family History  Problem Relation Age of Onset   Thyroid disease Mother    Arthritis Father    Thyroid disease Maternal Aunt    Diabetes Paternal Uncle    Thyroid disease Maternal Grandmother    Colon cancer Maternal Grandfather    Diabetes Paternal Grandmother    Breast cancer Neg Hx    Esophageal cancer Neg Hx    Rectal cancer Neg Hx    Stomach cancer Neg Hx    Social History    Socioeconomic History   Marital status: Married    Spouse name: Not on file   Number of children: 2   Years of education: Not on file   Highest education level: Not on file  Occupational History   Not on file  Tobacco Use   Smoking status: Former    Types: Cigarettes    Quit date: 07/07/2008    Years since quitting: 13.1   Smokeless tobacco: Never  Vaping Use   Vaping Use: Never used  Substance and Sexual Activity   Alcohol use: Yes    Comment: 3 drinks/week, combination of wine, beer, and liquor   Drug use: No   Sexual activity: Yes  Birth control/protection: I.U.D.  Other Topics Concern   Not on file  Social History Narrative   Not on file   Social Determinants of Health   Financial Resource Strain: Not on file  Food Insecurity: Not on file  Transportation Needs: Not on file  Physical Activity: Not on file  Stress: Not on file  Social Connections: Not on file   Outpatient Medications Prior to Visit  Medication Sig   Azelastine HCl 137 MCG/SPRAY SOLN 1 puff in each nostril   liothyronine (CYTOMEL) 5 MCG tablet Take 5 mcg by mouth 2 (two) times daily.   Multiple Vitamin (MULTIVITAMIN) tablet Take 1 tablet by mouth daily.   Probiotic Product (PROBIOTIC-10 PO) Take 1 capsule by mouth daily.   SYNTHROID 50 MCG tablet Take 50 mcg by mouth daily.   No facility-administered medications prior to visit.   Allergies  Allergen Reactions   Sulfa Antibiotics Hives and Rash   Immunization History  Administered Date(s) Administered   Influenza-Unspecified 05/08/2017    Health Maintenance Due: Health Maintenance  Topic Date Due   COVID-19 Vaccine (1) Never done   HIV Screening  Never done   Hepatitis C Screening  Never done   TETANUS/TDAP  Never done   INFLUENZA VACCINE  02/04/2021   PAP SMEAR-Modifier  04/05/2022   COLONOSCOPY (Pts 45-50yr Insurance coverage will need to be confirmed)  08/13/2026   HPV VACCINES  Aged Out    Review of Systems All review of  systems negative except what is listed in the HPI   Objective    BP 117/72    Pulse 98    Ht '5\' 4"'$  (1.626 m)    Wt 174 lb 11.2 oz (79.2 kg)    LMP  (LMP Unknown)    SpO2 (!) 72%    BMI 29.99 kg/m  Physical Exam Vitals and nursing note reviewed.  Constitutional:      Appearance: Normal appearance.  Eyes:     Extraocular Movements: Extraocular movements intact.     Conjunctiva/sclera: Conjunctivae normal.     Pupils: Pupils are equal, round, and reactive to light.  Neck:     Vascular: No carotid bruit.  Cardiovascular:     Rate and Rhythm: Normal rate and regular rhythm.     Pulses: Normal pulses.     Heart sounds: Normal heart sounds.  Pulmonary:     Effort: Pulmonary effort is normal.     Breath sounds: Normal breath sounds.  Abdominal:     General: Abdomen is flat. Bowel sounds are normal. There is no distension or abdominal bruit.     Palpations: Abdomen is soft. There is no mass.     Tenderness: There is no abdominal tenderness. There is no right CVA tenderness, left CVA tenderness, guarding or rebound.     Hernia: No hernia is present.  Musculoskeletal:        General: Swelling and tenderness present.     Cervical back: Normal range of motion.     Right hip: Tenderness present. No bony tenderness or crepitus. Normal range of motion. Normal strength.     Left hip: Normal.     Right lower leg: No edema.     Left lower leg: No edema.       Legs:     Comments: Deep pain from lumbar spine radiating into right buttocks and to the lateral thigh- present with straight leg raise.  Tenderness present to greater trochanter with light palpation.   Skin:  General: Skin is warm and dry.     Capillary Refill: Capillary refill takes less than 2 seconds.  Neurological:     General: No focal deficit present.     Mental Status: She is alert and oriented to person, place, and time.     Motor: No weakness.  Psychiatric:        Mood and Affect: Mood normal.        Behavior: Behavior  normal.        Thought Content: Thought content normal.        Judgment: Judgment normal.    No results found for any visits on 09/06/21.  Assessment & Plan      Problem List Items Addressed This Visit     Irritable bowel syndrome with constipation - Primary    Symptoms and presentation consistent with IBS.  Constipation is not present as stools are soft and formed, however, it does take a while for her bowels to evacuate after experiencing symptoms.  I do feel that this is related to spasms in the intestines related to IBS.  With no abnormalities noted on colonoscopy and no alarm symptoms present today, I do not feel that further GI evaluation is necessary at this time.  Recommend adding bentyl for cramping and nausea to see if this is helpful.  Will plan to start gabapentin for hip/back pain which also may be helpful for the pain associated.  Recommend avoid sitting more than 2 minutes for bowel movement.  If symptoms persist despite bentyl and gabapentin, we may need to consider testing for allergens that could be affecting her GI tract.       Relevant Medications   dicyclomine (BENTYL) 10 MG capsule   Hip pain, chronic, right    Symptoms appear consistent with trochanteric bursitis of the right hip.  Will send referral to Dr. Sammuel Hines for further evaluation and recommendations.  Will start gabapentin today and recommend NSAID use to help with pain and inflammation. Ice and rest may be helpful with gentle stretching exercises.  Suspect that altered gait due to suspected piriformis and low back pain have contributed to her symptoms.  Will follow with ortho.       Relevant Medications   gabapentin (NEURONTIN) 300 MG capsule   Other Relevant Orders   AMB referral to orthopedics   Lumbar pain with radiation down right leg    Symptoms consistent with lumbar radiculopathy and piriformis syndrome.  No alarm symptoms present today. Will send referral to Dr. Sammuel Hines for further  evaluation and recommendations.  Recommend ice and heat to low back and gentle stretching exercises. Will send gabapentin to see if this is helpful for her pain. Recommend NSAIDs to help with inflammation.       Relevant Medications   gabapentin (NEURONTIN) 300 MG capsule   Other Relevant Orders   AMB referral to orthopedics     Return for July for CPE.     Adewale Pucillo, Coralee Pesa, NP, DNP, AGNP-C [facility]

## 2021-09-09 NOTE — Assessment & Plan Note (Signed)
Symptoms and presentation consistent with IBS.  ?Constipation is not present as stools are soft and formed, however, it does take a while for her bowels to evacuate after experiencing symptoms.  ?I do feel that this is related to spasms in the intestines related to IBS.  ?With no abnormalities noted on colonoscopy and no alarm symptoms present today, I do not feel that further GI evaluation is necessary at this time.  ?Recommend adding bentyl for cramping and nausea to see if this is helpful.  ?Will plan to start gabapentin for hip/back pain which also may be helpful for the pain associated.  ?Recommend avoid sitting more than 2 minutes for bowel movement.  ?If symptoms persist despite bentyl and gabapentin, we may need to consider testing for allergens that could be affecting her GI tract.  ?

## 2021-09-09 NOTE — Assessment & Plan Note (Signed)
Symptoms consistent with lumbar radiculopathy and piriformis syndrome.  ?No alarm symptoms present today. Will send referral to Dr. Sammuel Hines for further evaluation and recommendations.  ?Recommend ice and heat to low back and gentle stretching exercises. Will send gabapentin to see if this is helpful for her pain. Recommend NSAIDs to help with inflammation.  ?

## 2021-09-18 ENCOUNTER — Other Ambulatory Visit: Payer: Self-pay

## 2021-09-18 ENCOUNTER — Ambulatory Visit (HOSPITAL_BASED_OUTPATIENT_CLINIC_OR_DEPARTMENT_OTHER): Payer: 59 | Admitting: Orthopaedic Surgery

## 2021-09-18 ENCOUNTER — Ambulatory Visit (HOSPITAL_BASED_OUTPATIENT_CLINIC_OR_DEPARTMENT_OTHER)
Admission: RE | Admit: 2021-09-18 | Discharge: 2021-09-18 | Disposition: A | Discharge status: Home / Self Care | Payer: 59 | Source: Ambulatory Visit | Attending: Orthopaedic Surgery | Admitting: Orthopaedic Surgery

## 2021-09-18 ENCOUNTER — Other Ambulatory Visit (HOSPITAL_BASED_OUTPATIENT_CLINIC_OR_DEPARTMENT_OTHER): Payer: Self-pay | Admitting: Orthopaedic Surgery

## 2021-09-18 DIAGNOSIS — S76011A Strain of muscle, fascia and tendon of right hip, initial encounter: Secondary | ICD-10-CM | POA: Diagnosis not present

## 2021-09-18 DIAGNOSIS — G8929 Other chronic pain: Secondary | ICD-10-CM

## 2021-09-18 DIAGNOSIS — M25551 Pain in right hip: Secondary | ICD-10-CM | POA: Insufficient documentation

## 2021-09-18 NOTE — Progress Notes (Signed)
? ?                            ? ? ?Chief Complaint: Right hip pain  ?  ? ? ?History of Present Illness:  ? ? ?Kristen Morgan is a 46 y.o. female presents today for her persistent lateral right hip pain.  She states this has been going on for several months.  She has previously been seen by an outside provider [facility].  An injection was performed in early January into the right greater trochanter region which she states gave her approximately 2 days of relief.  This pain is subsequently recurred.  She states that the pain is worse with walking and going up and down stairs.  She walks for exercise although this is quite limited as a result of her lateral trochanteric pain.  She works for [employer].  She states that she has pain with laying directly on the side.  She has tried naproxen in the past without any help.  She does apply ice and heat but these are only transient.  She does have a history of lumbar back pain which she has previously seen a Restaurant manager, fast food.  She has previously had epidural injections in the back but this has not helped her pain.  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  This is very helpful for her as I base pain although her lateral based pain that she is here today is different than that. ? ? ? ?Surgical History:   ?None ? ?PMH/PSH/Family History/Social History/Meds/Allergies:   ? ?Past Medical History:  ?Diagnosis Date  ? 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 IBS (irritable bowel syndrome)   ? Thyroid disease   ? Tubular adenoma of colon 08/2017  ? ?Past Surgical History:  ?Procedure Laterality Date  ? CERVICAL CONIZATION W/BX N/A 06/15/2019  ? Procedure: CONIZATION CERVIX WITH BIOPSY;  Surgeon: [name], MD;  Location: [facility];  Service: Gynecology;  Laterality: N/A;  ? CESAREAN SECTION    ? x 2  ? COLONOSCOPY    ? DILATATION & CURETTAGE/HYSTEROSCOPY WITH MYOSURE N/A 06/15/2019  ? Procedure:  [place] [place];  Surgeon: [name], MD;  Location: [facility];  Service: Gynecology;  Laterality: N/A;  ? INTRAUTERINE DEVICE (IUD) INSERTION N/A 06/15/2019  ? Procedure: INTRAUTERINE DEVICE (IUD) INSERTION;  Surgeon: [name], MD;  Location: [facility];  Service: Gynecology;  Laterality: N/A;  ? LEEP    ? ?Social History  ? ?Socioeconomic History  ? Marital status: Married  ?  Spouse name: Not on file  ? Number of children: 2  ? Years of education: Not on file  ? Highest education level: Not on file  ?Occupational History  ? Not on file  ?Tobacco Use  ? Smoking status: Former  ?  Types: Cigarettes  ?  Quit date: 07/07/2008  ?  Years since quitting: 13.2  ? Smokeless tobacco: Never  ?Vaping Use  ? Vaping Use: Never used  ?Substance and Sexual Activity  ? Alcohol use: Yes  ?  Comment: 3 drinks/week, combination of wine, beer, and liquor  ? Drug use: No  ? Sexual activity: Yes  ?  Birth control/protection: I.U.D.  ?Other Topics Concern  ? Not on file  ?Social History Narrative  ? Not on file  ? ?Social Determinants of Health  ? ?  Financial Resource Strain: Not on file  ?Food Insecurity: Not on file  ?Transportation Needs: Not on file  ?Physical Activity: Not on file  ?Stress: Not on file  ?Social Connections: Not on file  ? ?Family History  ?Problem Relation Age of Onset  ? Thyroid disease Mother   ? Arthritis Father   ? Thyroid disease Maternal Aunt   ? Diabetes Paternal Uncle   ? Thyroid disease Maternal Grandmother   ? Colon cancer Maternal Grandfather   ? Diabetes Paternal Grandmother   ? Breast cancer Neg Hx   ? Esophageal cancer Neg Hx   ? Rectal cancer Neg Hx   ? Stomach cancer Neg Hx   ? ?Allergies  ?Allergen Reactions  ? Sulfa Antibiotics Hives and Rash  ? ?Current Outpatient Medications  ?Medication Sig Dispense Refill  ? Azelastine HCl 137 MCG/SPRAY SOLN 1 puff in each nostril    ? dicyclomine (BENTYL) 10 MG capsule Take 1 capsule  (10 mg total) by mouth 3 (three) times daily before meals. 90 capsule 11  ? gabapentin (NEURONTIN) 300 MG capsule Take 1 capsule (300 mg total) by mouth at bedtime. 30 capsule 3  ? liothyronine (CYTOMEL) 5 MCG tablet Take 5 mcg by mouth 2 (two) times daily.    ? Multiple Vitamin (MULTIVITAMIN) tablet Take 1 tablet by mouth daily.    ? Probiotic Product (PROBIOTIC-10 PO) Take 1 capsule by mouth daily.    ? SYNTHROID 50 MCG tablet Take 50 mcg by mouth daily.    ? ?No current facility-administered medications for this visit.  ? ?No results found. ? ?Review of Systems:   ?A ROS was performed including pertinent positives and negatives as documented in the HPI. ? ?Physical Exam :   ?Constitutional: NAD and appears stated age ?Neurological: Alert and oriented ?Psych: Appropriate affect and cooperative ?There were no vitals taken for this visit.  ? ?Comprehensive Musculoskeletal Exam:   ? ?Inspection Right Left  ?Skin No atrophy or gross abnormalities appreciated No atrophy or gross abnormalities appreciated  ?Palpation    ?Tenderness None None  ?Crepitus None None  ?Range of Motion    ?Flexion (passive) 120 120  ?Extension 30 30  ?IR 30 30  ?ER 45 with lateral trochanteric pain 45  ?Strength    ?Flexion  5/5 5/5  ?Extension 5/5 5/5  ?Special Tests    ?FABER Negative Negative  ?FADIR Negative Negative  ?ER Lag/Capsular Insufficiency Negative Negative  ?Instability Negative Negative  ?Sacroiliac pain Positive Negative   ?Instability    ?Generalized Laxity No No  ?Neurologic    ?sciatic, femoral, obturator nerves intact to light sensation  ?Vascular/Lymphatic    ?DP pulse 2+ 2+  ?Lumbar Exam    ?Patient has symmetric lumbar range of motion with negative pain referral to hip  ? ? mildly positive Trendelenburg sign on the right with weakness with resisted abduction ? ?Imaging:   ?Xray (AP pelvis, right hip 3 views): ?There is a significant focus of ossification of the tip of the greater greater trochanter at the gluteus medius  insertion ? ? ?I personally reviewed and interpreted the radiographs. ? ? 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  I did describe that her focus of ossification about the greater trochanter is consistent with tendinopathy.  At today's visit I would specifically recommend an ultrasound-guided injection around the gluteus medius so that we can help to diagnose and treat her issue.  I would  also recommend a physical therapy program for strengthening about the hip. ? ?Plan :   ? ?-Plan for physical therapy for right gluteus medius program ?-Return to clinic in 6 weeks for reassessment ? ? ? ? ?I personally saw and evaluated the patient, and participated in the management and treatment plan. ? ?Vanetta Mulders, MD ?Attending Physician, Orthopedic Surgery ? ?This document was dictated using Systems analyst. A reasonable attempt at proof reading has been made to minimize errors. ?

## 2021-10-09 ENCOUNTER — Encounter (HOSPITAL_BASED_OUTPATIENT_CLINIC_OR_DEPARTMENT_OTHER): Payer: Self-pay | Admitting: Physical Therapy

## 2021-10-09 ENCOUNTER — Ambulatory Visit (HOSPITAL_BASED_OUTPATIENT_CLINIC_OR_DEPARTMENT_OTHER): Payer: 59 | Attending: Orthopaedic Surgery | Admitting: Physical Therapy

## 2021-10-09 DIAGNOSIS — M25551 Pain in right hip: Secondary | ICD-10-CM | POA: Insufficient documentation

## 2021-10-09 DIAGNOSIS — G8929 Other chronic pain: Secondary | ICD-10-CM | POA: Diagnosis not present

## 2021-10-09 DIAGNOSIS — M6281 Muscle weakness (generalized): Secondary | ICD-10-CM | POA: Diagnosis not present

## 2021-10-09 NOTE — Therapy (Signed)
?OUTPATIENT PHYSICAL THERAPY LOWER EXTREMITY EVALUATION ? ? ?Patient Name: Kristen Morgan ?MRN: [id number] ?DOB:[birth year], 46 y.o., female ?Today's Date: 10/10/2021 ? ? PT End of Session - 10/09/21 1418   ? ? Visit Number 1   ? Number of Visits 12   ? Date for PT Re-Evaluation 11/20/21   ? PT Start Time 1309   ? PT Stop Time 1410   ? PT Time Calculation (min) 61 min   ? Activity Tolerance Patient tolerated treatment well   ? Behavior During Therapy [facility] for tasks assessed/performed   ? ?  ?  ? ?  ? ? ?Past Medical History:  ?Diagnosis Date  ? 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 IBS (irritable bowel syndrome)   ? Thyroid disease   ? Tubular adenoma of colon 08/2017  ? ?Past Surgical History:  ?Procedure Laterality Date  ? CERVICAL CONIZATION W/BX N/A 06/15/2019  ? Procedure: CONIZATION CERVIX WITH BIOPSY;  Surgeon: [name], MD;  Location: [facility];  Service: Gynecology;  Laterality: N/A;  ? CESAREAN SECTION    ? x 2  ? COLONOSCOPY    ? DILATATION & CURETTAGE/HYSTEROSCOPY WITH MYOSURE N/A 06/15/2019  ? Procedure: [place];  Surgeon: [name], MD;  Location: [facility];  Service: Gynecology;  Laterality: N/A;  ? INTRAUTERINE DEVICE (IUD) INSERTION N/A 06/15/2019  ? Procedure: INTRAUTERINE DEVICE (IUD) INSERTION;  Surgeon: [name], MD;  Location: [place];  Service: Gynecology;  Laterality: N/A;  ? LEEP    ? ?Patient Active Problem List  ? Diagnosis Date Noted  ? Irritable bowel syndrome with constipation 09/09/2021  ? Hip pain, chronic, right 09/09/2021  ? Lumbar pain with radiation down right leg 09/09/2021  ? Change in bowel habits 08/13/2017  ? Bloating 08/13/2017  ? ? ?PCP: Orma Render, NP ? ?REFERRING PROVIDER: Vanetta Mulders, MD ? ?REFERRING DIAG: M25.551,G89.29 (ICD-10-CM) - Hip pain, chronic, right ? ?THERAPY DIAG:  ?Pain in right hip ? ?Muscle weakness (generalized) ? ?ONSET DATE: November  2021 ? ?SUBJECTIVE:  ? ?SUBJECTIVE STATEMENT: ?Pt has a hx of chronic lumbar pain.  She has received received chiropractic care and injections which help her lumbar pain.  Pt reports her hip pain is a different type of pain.  Pt reports she has noticed a hip pain for 1-2 years intermittently though has worsened in the past 6 months.  Pt had an injection in early January into the right greater trochanter region which gave her relief for approximately 2 days. ? ?Pt saw her primary and note indicates sx's consistent with R hip trochanteric bursitis for hip and sx's consistent with lumbar radiculopathy and piriformis syndrome for lumbar.  PA presribed gabapentin for hip/back pain and also referred pt to Dr. Sammuel Hines in orthopedics.   ? ?Pt saw Dr. 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  Pt received an ultrasound-guided injection around the gluteus medius and Pt reports significant improvement since having injection.  MD ordered PT for strengthening about the hip and MD script indicated R hip glut program for gluteus medius tendinopathy  ? ?Pt has been using ice and heat and has to adjust positions frequently for pain relief.  Pt works from home and uses a sit to [employer].  She stands more for work activities due to pain with sitting.  Pt was having difficulty with sleeping due to pain though has improved  sine the injection.  Pt has occasional pain with stairs. She walks for exercise and walks a couple of miles per day.  Pt states she is not limited with ambulation due to pain. She has pain with lying on R side.  This has improved with the injection though is still very limited.  Pt has a pulling sensation with crossing R leg.   ? ?Primary c/o:  pain with sitting and sleeping. ? ? ? ?PERTINENT HISTORY: ?Chronic lumbar pain ? ?PAIN:  ?Are you having pain? Yes ?NPRS:  2/10 current, 6-7/10 worst, 0/10 best ?Location:  R lateral hip  pain ? ?PRECAUTIONS: chronic lumbar pain ? ?WEIGHT BEARING RESTRICTIONS No ? ?FALLS:  ?Has patient fallen in last 6 months? No ? ?LIVING ENVIRONMENT: ?Lives with: lives with their family and lives with their spouse ?Lives in: 2 story home ?Stairs: yes ?Has following equipment at home: None ? ?OCCUPATION: Herbalist for [employer] ? ?PLOF: Independent; Pt was able to perform her normal mobility and stairs without significant hip pain.  Pt was able to sleep without hip pain disturbing her.  ? ?PATIENT GOALS improve pain ? ? ?OBJECTIVE:  ? ?DIAGNOSTIC FINDINGS:  ?R hip x ray:  IMPRESSION: ?No significant right femoroacetabular osteoarthritis.  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 ? ?MD note:  Xray pelvis, right hip: ?There is a significant focus of ossification of the tip of the greater trochanter at the gluteus medius insertion ? ? ?PATIENT SURVEYS:  ?FOTO 61 with a goal of 71 at visit #12 ? ?COGNITION: ? Overall cognitive status: Within functional limits for tasks assessed   ?  ? ?PALPATION: ?TTP:  R greater trochanter ? ?LE ROM: ? ?STRENGTH Right ?10/10/2021 Left ?10/10/2021  ?Hip flexion 5/5 5/5  ?Hip extension 5/5 5/5  ?Hip abduction 5/5 5/5  ?Hip adduction 5/5 5/5  ?Hip internal rotation 4/5 5/5  ?Hip external rotation 4+/5 5/5  ?Knee flexion 5/5 5/5  ?Knee extension 5/5 5/5  ?Ankle dorsiflexion    ?Ankle plantarflexion    ?Ankle inversion    ?Ankle eversion    ? (Blank rows = not tested) ? ?LE AROM: ? ?AROM Right ?10/10/2021 Left ?10/10/2021  ?Hip flexion    ?Hip extension WNL WNL  ?Hip abduction [facility] WFL  ?Hip adduction WNL WNL  ?Hip internal rotation 24 26  ?Hip external rotation 29  30  ?Knee flexion    ?Knee extension    ?Ankle dorsiflexion    ?Ankle plantarflexion    ?Ankle inversion    ?Ankle eversion    ? (Blank rows = not tested) ? ?LOWER EXTREMITY SPECIAL TESTS:  ?FABER'S TEST:  Negative bilat ? ? ? ?GAIT: ?Assistive device utilized: None ?Level of assistance: Complete  Independence ?Comments:  Pt ambulated with a normalized heel to toe gait pattern without limping.  ? ? ? ?TODAY'S TREATMENT: ?Pt performed S/L hip abd with eccentric focus and S/L clamshell 2x10 reps each.  Pt received a HEP handout and was educated in correct form and appropriate frequency.  Pt instructed that these exercises should not cause pain and to stop exercises if they do cause pain. ?See below for pt education. ? ? ?PATIENT EDUCATION:  ?Education details: Dx, relevant anatomy, rationale of eccentric exercise, HEP.  PT educated pt concerning anatomy and action of gluteus medius via internet pictures.   ?Person educated: Patient ?Education method: Explanation, Demonstration, Tactile cues, Verbal cues, and Handouts ?Education comprehension: verbalized understanding, returned demonstration, verbal cues required, tactile cues required, and needs  further education ? ? ?HOME EXERCISE PROGRAM: ?Access Code: [email or web address] ?URL: [email or web address] ?Date: 10/09/2021 ?Prepared by: Ronny Flurry ? ?Exercises ?- Sidelying Hip Abduction  - 1 x daily - 5-6 x weekly - 2 sets - 10 reps ?- Clamshell  - 1 x daily - 5-6 x weekly - 2 sets - 10 reps ? ?ASSESSMENT: ? ?CLINICAL IMPRESSION: ?Patient is a 46 y.o. female with a dx of R chronic hip pain.  She has a hx of chronic LBP.  Pt received an US guided injection around the gluteus medius and reports significant improvement.  Her primary c/o's are pain with sitting and sleeping.  She uses her standing desk more with work activities due to pain with sitting.  Pt reports much improved pain with sleeping and also has improved pain with lying on R side since the injection.  Pt has occasional pain with stairs.  Pt has weakness in R hip ER and IR.  Pt should benefit from skilled PT services to address impairments and improve overall function.  ? ? ? ?OBJECTIVE IMPAIRMENTS decreased mobility, decreased ROM, decreased strength, hypomobility, and pain.  ? ?ACTIVITY  LIMITATIONS  sitting .  ? ?PERSONAL FACTORS 1 comorbidity: chronic lumbar pain  are also affecting patient's functional outcome.  ? ? ?REHAB POTENTIAL: Good ? ?CLINICAL DECISION MAKING: Stable/uncomplicated ? ?EVALUATION COMPLEXIT

## 2021-10-22 NOTE — Therapy (Signed)
?OUTPATIENT PHYSICAL THERAPY TREATMENT NOTE ? ? ?Patient Name: Kristen Morgan ?MRN: [id number] ?DOB:03-14-1976, 46 y.o., female ?Today's Date: 10/23/2021 ? ?PCP: Orma Render, NP ?REFERRING PROVIDER: Vanetta Mulders, MD ? ? ? PT End of Session - 10/23/21 1455   ? ? Visit Number 2   ? Number of Visits 12   ? Date for PT Re-Evaluation 11/20/21   ?PT start time 1315 ?PT stop time 1400 ?Patient tolerated treatment well  ?  ? ?  ? ? ?Past Medical History:  ?Diagnosis Date  ? 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 IBS (irritable bowel syndrome)   ? Thyroid disease   ? Tubular adenoma of colon 08/2017  ? ?Past Surgical History:  ?Procedure Laterality Date  ? CERVICAL CONIZATION W/BX N/A 06/15/2019  ? Procedure: CONIZATION CERVIX WITH BIOPSY;  Surgeon: [name], MD;  Location: [facility];  Service: Gynecology;  Laterality: N/A;  ? CESAREAN SECTION    ? x 2  ? COLONOSCOPY    ? DILATATION & CURETTAGE/HYSTEROSCOPY WITH MYOSURE N/A 06/15/2019  ? Procedure: [place];  Surgeon: [name], MD;  Location: [facility];  Service: Gynecology;  Laterality: N/A;  ? INTRAUTERINE DEVICE (IUD) INSERTION N/A 06/15/2019  ? Procedure: INTRAUTERINE DEVICE (IUD) INSERTION;  Surgeon: [name], MD;  Location: [place];  Service: Gynecology;  Laterality: N/A;  ? LEEP    ? ?Patient Active Problem List  ? Diagnosis Date Noted  ? Irritable bowel syndrome with constipation 09/09/2021  ? Hip pain, chronic, right 09/09/2021  ? Lumbar pain with radiation down right leg 09/09/2021  ? Change in bowel habits 08/13/2017  ? Bloating 08/13/2017  ? ? ?REFERRING DIAG: M25.551,G89.29 (ICD-10-CM) - Hip pain, chronic, right ? ?THERAPY DIAG: Pain in right hip ? ? ?PERTINENT HISTORY: Chronic lumbar pain ? ?PRECAUTIONS: chronic lumbar pain ? ?SUBJECTIVE: Tender lateral hip following exercise.  Did a 2 mile hike and needed ice afterwards.   ? ? ?PAIN:  ?Are you  having pain? Yes ?NPRS scale: 2/10 ?Pain location: right lateral hip ?  ? ? ? ?OBJECTIVE:  ?  ?DIAGNOSTIC FINDINGS:  ?R hip x ray:  IMPRESSION: ?No significant right femoroacetabular osteoarthritis.  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 ?  ?MD note:  Xray pelvis, right hip: ?There is a significant focus of ossification of the tip of the greater trochanter at the gluteus medius insertion ?  ?  ?PATIENT SURVEYS:  ?FOTO 61 with a goal of 71 at visit #12 ?  ?COGNITION: ?          Overall cognitive status: Within functional limits for tasks assessed               ?           ?  ?PALPATION: ?TTP:  R greater trochanter ?  ?LE ROM: ?  ?STRENGTH Right ?10/10/2021 Left ?10/10/2021  ?Hip flexion 5/5 5/5  ?Hip extension 5/5 5/5  ?Hip abduction 5/5 5/5  ?Hip adduction 5/5 5/5  ?Hip internal rotation 4/5 5/5  ?Hip external rotation 4+/5 5/5  ?Knee flexion 5/5 5/5  ?Knee extension 5/5 5/5  ?Ankle dorsiflexion      ?Ankle plantarflexion      ?Ankle inversion      ?Ankle eversion      ? (Blank rows = not tested) ?  ?LE AROM: ?  ?AROM Right ?10/10/2021 Left ?10/10/2021  ?Hip flexion      ?  Hip extension WNL WNL  ?Hip abduction [facility] WFL  ?Hip adduction WNL WNL  ?Hip internal rotation 24 26  ?Hip external rotation 29  30  ?Knee flexion      ?Knee extension      ?Ankle dorsiflexion      ?Ankle plantarflexion      ?Ankle inversion      ?Ankle eversion      ? (Blank rows = not tested) ?  ?LOWER EXTREMITY SPECIAL TESTS:  ?FABER'S TEST:  Negative bilat ?  ?  ?  ?GAIT: ?Assistive device utilized: None ?Level of assistance: Complete Independence ?Comments:  Pt ambulated with a normalized heel to toe gait pattern without limping.  ? TODAY'S TREATMENT: ?4/19: ?Manual therapy: right hip long axis distraction, inferior mobs, AP in internal rotation, prone PA, PA in internal rotation, PA in external rotation ?Supine isometric clam 10 sec hold 10x;  attempted mini bridge but discontinued secondary to painful twinge ?Supine:  transverse ab brace, with isometric hand to opp knee, bent knee raises and lowers 5x ?Modified plank on knees 10 sec hold 5x ?Standing "imaginary splits" isometric 10 sec hold 5x ?Bil mini squats and offset squats 5x each ?Bil heel raises 15x ? ?Eval treatment:Pt performed S/L hip abd with eccentric focus and S/L clamshell 2x10 reps each.  Pt received a HEP handout and was educated in correct form and appropriate frequency.  Pt instructed that these exercises should not cause pain and to stop exercises if they do cause pain. ?See below for pt education. ?  ?  ?PATIENT EDUCATION:  ?Education details: Dx, relevant anatomy, rationale of eccentric exercise, HEP.  PT educated pt concerning anatomy and action of gluteus medius via internet pictures.   ?Person educated: Patient ?Education method: Explanation, Demonstration, Tactile cues, Verbal cues, and Handouts ?Education comprehension: verbalized understanding, returned demonstration, verbal cues required, tactile cues required, and needs further education ?  ?  ?HOME EXERCISE PROGRAM: ?Access Code: [email or web address] ?URL: [email or web address] ?Date: 10/09/2021 ?Prepared by: Ronny Flurry ?  ?Exercises ?- Sidelying Hip Abduction  - 1 x daily - 5-6 x weekly - 2 sets - 10 reps ?- Clamshell  - 1 x daily - 5-6 x weekly - 2 sets - 10 reps ?  ?ASSESSMENT: ?  ?CLINICAL IMPRESSION: ?The patient reports tenderness from glute ex's so discussed isometric modifications if needed.  Initiated core strengthening of lower abdominals with cues to avoid holding breath needed.  Improved posterior and inferior mobility following manual therapy.  Therapist monitoring response to all interventions and modifying treatment accordingly.   ?  ?  ?  ?OBJECTIVE IMPAIRMENTS decreased mobility, decreased ROM, decreased strength, hypomobility, and pain.  ?  ?ACTIVITY LIMITATIONS  sitting .  ?  ?PERSONAL FACTORS 1 comorbidity: chronic lumbar pain  are also affecting patient's functional outcome.   ?  ?  ?REHAB POTENTIAL: Good ?  ?CLINICAL DECISION MAKING: Stable/uncomplicated ?  ?EVALUATION COMPLEXITY: Low ?  ?  ?GOALS: ?  ?  ?SHORT TERM GOALS: Target date: 10/30/2021 ?  ?Pt will be independent and compliant with HEP for improved pain, strength, and function.  ?Baseline: ?Goal status: INITIAL ?  ?2.  Pt will report at least a 25% reduction in pain with sitting. ?Baseline:  ?Goal status: INITIAL ?  ?  ?LONG TERM GOALS: Target date: 11/20/2021 ?  ?Pt will demo improved R hip ER and IR strength to 5/5 MMT for improved tolerance with and performance of functional mobility.  ?Baseline:  ?Goal status: INITIAL ?  ?2.  Pt will be able to perform stairs without hip pain. ?Baseline:  ?Goal status: INITIAL ?  ?3.  Pt will be able to sleep without hip pain waking her up. ?Baseline:  ?Goal status: INITIAL ?  ?  ?  ?PLAN: ?PT FREQUENCY: 2x/week ?  ?PT DURATION: 6 weeks ?  ?PLANNED INTERVENTIONS: Therapeutic exercises, Therapeutic activity, Neuromuscular re-education, Gait training, Patient/Family education, Joint mobilization, Aquatic Therapy, Dry Needling, Electrical stimulation, Spinal mobilization, Cryotherapy, Moist heat, Taping, Ultrasound, and Manual therapy ?  ?PLAN FOR NEXT SESSION:  assess response to glute isometrics and core ex's; slow progression; manual therapy as needed ? ?Ruben Im, PT ?10/23/21 3:02 PM ?Phone: [phone] ?Fax: [phone]  ?  ?  ? ? ? ?   ?

## 2021-10-23 ENCOUNTER — Ambulatory Visit (HOSPITAL_BASED_OUTPATIENT_CLINIC_OR_DEPARTMENT_OTHER): Payer: 59 | Admitting: Physical Therapy

## 2021-10-23 DIAGNOSIS — M6281 Muscle weakness (generalized): Secondary | ICD-10-CM

## 2021-10-23 DIAGNOSIS — M25551 Pain in right hip: Secondary | ICD-10-CM | POA: Diagnosis not present

## 2021-10-30 ENCOUNTER — Ambulatory Visit (HOSPITAL_BASED_OUTPATIENT_CLINIC_OR_DEPARTMENT_OTHER): Payer: 59 | Admitting: Orthopaedic Surgery

## 2021-10-30 DIAGNOSIS — G8929 Other chronic pain: Secondary | ICD-10-CM | POA: Diagnosis not present

## 2021-10-30 DIAGNOSIS — M25551 Pain in right hip: Secondary | ICD-10-CM | POA: Diagnosis not present

## 2021-10-30 NOTE — Progress Notes (Signed)
? ?                            ? ? ?Chief Complaint: Right hip pain  ?  ? ? ?History of Present Illness:  ? ?10/30/2021: Presents today for follow-up of her right hip.  She states that she felt extremely good after an injection.  She had essentially no pain.  Presents today feeling like the injection is worn off some.  She continues to work on her home exercises that she was shown at physical therapy for strengthening of the hip.  She does overall feel dramatically better although she continues to work through weakness in the right hip. ? ?Kristen Morgan is a 46 y.o. female presents today for her persistent lateral right hip pain.  She states this has been going on for several months.  She has previously been seen by an outside provider [facility].  An injection was performed in early January into the right greater trochanter region which she states gave her approximately 2 days of relief.  This pain is subsequently recurred.  She states that the pain is worse with walking and going up and down stairs.  She walks for exercise although this is quite limited as a result of her lateral trochanteric pain.  She works for [employer].  She states that she has pain with laying directly on the side.  She has tried naproxen in the past without any help.  She does apply ice and heat but these are only transient.  She does have a history of lumbar back pain which she has previously seen a Restaurant manager, fast food.  She has previously had epidural injections in the back but this has not helped her pain.  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  This is very helpful for her as I base pain although her lateral based pain that she is here today is different than that. ? ? ? ?Surgical History:   ?None ? ?PMH/PSH/Family History/Social History/Meds/Allergies:   ? ?Past Medical History:  ?Diagnosis Date  ? 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 IBS (irritable bowel syndrome)   ? Thyroid  disease   ? Tubular adenoma of colon 08/2017  ? ?Past Surgical History:  ?Procedure Laterality Date  ? CERVICAL CONIZATION W/BX N/A 06/15/2019  ? Procedure: CONIZATION CERVIX WITH BIOPSY;  Surgeon: [name], MD;  Location: [facility];  Service: Gynecology;  Laterality: N/A;  ? CESAREAN SECTION    ? x 2  ? COLONOSCOPY    ? DILATATION & CURETTAGE/HYSTEROSCOPY WITH MYOSURE N/A 06/15/2019  ? Procedure: [place];  Surgeon: [name], MD;  Location[facility];  Service: Gynecology;  Laterality: N/A;  ? INTRAUTERINE DEVICE (IUD) INSERTION N/A 06/15/2019  ? Procedure: INTRAUTERINE DEVICE (IUD) INSERTION;  Surgeon: [name], MD;  Location: [place];  Service: Gynecology;  Laterality: N/A;  ? LEEP    ? ?Social History  ? ?Socioeconomic History  ? Marital status: Married  ?  Spouse name: Not on file  ? Number of children: 2  ? Years of education: Not on file  ? Highest education level: Not on file  ?Occupational History  ? Not on file  ?Tobacco Use  ? Smoking status: Former  ?  Types: Cigarettes  ?  Quit date: 07/07/2008  ?  Years since quitting: 13.3  ?  Smokeless tobacco: Never  ?Vaping Use  ? Vaping Use: Never used  ?Substance and Sexual Activity  ? Alcohol use: Yes  ?  Comment: 3 drinks/week, combination of wine, beer, and liquor  ? Drug use: No  ? Sexual activity: Yes  ?  Birth control/protection: I.U.D.  ?Other Topics Concern  ? Not on file  ?Social History Narrative  ? Not on file  ? ?Social Determinants of Health  ? ?Financial Resource Strain: Not on file  ?Food Insecurity: Not on file  ?Transportation Needs: Not on file  ?Physical Activity: Not on file  ?Stress: Not on file  ?Social Connections: Not on file  ? ?Family History  ?Problem Relation Age of Onset  ? Thyroid disease Mother   ? Arthritis Father   ? Thyroid disease Maternal Aunt   ? Diabetes Paternal Uncle   ? Thyroid disease Maternal Grandmother   ?  Colon cancer Maternal Grandfather   ? Diabetes Paternal Grandmother   ? Breast cancer Neg Hx   ? Esophageal cancer Neg Hx   ? Rectal cancer Neg Hx   ? Stomach cancer Neg Hx   ? ?Allergies  ?Allergen Reactions  ? Sulfa Antibiotics Hives and Rash  ? ?Current Outpatient Medications  ?Medication Sig Dispense Refill  ? Azelastine HCl 137 MCG/SPRAY SOLN 1 puff in each nostril    ? dicyclomine (BENTYL) 10 MG capsule Take 1 capsule (10 mg total) by mouth 3 (three) times daily before meals. 90 capsule 11  ? gabapentin (NEURONTIN) 300 MG capsule Take 1 capsule (300 mg total) by mouth at bedtime. 30 capsule 3  ? liothyronine (CYTOMEL) 5 MCG tablet Take 5 mcg by mouth 2 (two) times daily.    ? Multiple Vitamin (MULTIVITAMIN) tablet Take 1 tablet by mouth daily.    ? Probiotic Product (PROBIOTIC-10 PO) Take 1 capsule by mouth daily.    ? SYNTHROID 50 MCG tablet Take 50 mcg by mouth daily.    ? ?No current facility-administered medications for this visit.  ? ?No results found. ? ?Review of Systems:   ?A ROS was performed including pertinent positives and negatives as documented in the HPI. ? ?Physical Exam :   ?Constitutional: NAD and appears stated age ?Neurological: Alert and oriented ?Psych: Appropriate affect and cooperative ?There were no vitals taken for this visit.  ? ?Comprehensive Musculoskeletal Exam:   ? ?Inspection Right Left  ?Skin No atrophy or gross abnormalities appreciated No atrophy or gross abnormalities appreciated  ?Palpation    ?Tenderness None None  ?Crepitus None None  ?Range of Motion    ?Flexion (passive) 120 120  ?Extension 30 30  ?IR 30 30  ?ER 45 with lateral trochanteric pain 45  ?Strength    ?Flexion  5/5 5/5  ?Extension 5/5 5/5  ?Special Tests    ?FABER Negative Negative  ?FADIR Negative Negative  ?ER Lag/Capsular Insufficiency Negative Negative  ?Instability Negative Negative  ?Sacroiliac pain Positive Negative   ?Instability    ?Generalized Laxity No No  ?Neurologic    ?sciatic, femoral,  obturator nerves intact to light sensation  ?Vascular/Lymphatic    ?DP pulse 2+ 2+  ?Lumbar Exam    ?Patient has symmetric lumbar range of motion with negative pain referral to hip  ? ? mildly positive Trendelenburg sign on the right with weakness with resisted abduction ? ?Imaging:   ?Xray (AP pelvis, right hip 3 views): ?There is a significant focus of ossification of the tip of the greater greater trochanter at the gluteus medius  insertion ? ? ?I personally reviewed and interpreted the radiographs. ? ? 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  At today's visit she is doing better.  She continues to strengthen and continues to feel improvements with this.  I would overall like her to trial an additional 4 weeks of strengthening.  She will contact me via MyChart if no improvement following this or if she is not quite satisfied with her recovery. ? ?Plan :   ? ?-Return to clinic in 6 weeks if not feeling improvement in the hip at that time we would consider MRI ? ? ? ? ?I personally saw and evaluated the patient, and participated in the management and treatment plan. ? ?Vanetta Mulders, MD ?Attending Physician, Orthopedic Surgery ? ?This document was dictated using Systems analyst. A reasonable attempt at proof reading has been made to minimize errors. ?

## 2021-10-30 NOTE — Therapy (Signed)
?OUTPATIENT PHYSICAL THERAPY TREATMENT NOTE ? ? ?Patient Name: Kristen Morgan ?MRN: [id number] ?DOB:Aug 26, 1975, 46 y.o., female ?Today's Date: 11/01/2021 ? ?PCP: Orma Render, NP ?REFERRING PROVIDER: Vanetta Mulders, MD ? ? ?Visit Number:     3 ?Number of visits:  12 ?Date for PT revaluation:  11/20/2021 ?PT start time:  0852 ?PT end time:  932 ?PT Time:  40 mins ?Activity tolerance:  Pt tolerated treatment well. ?Behavior during therapy:  WFL for tasks performed ? ? ?Past Medical History:  ?Diagnosis Date  ? 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 IBS (irritable bowel syndrome)   ? Thyroid disease   ? Tubular adenoma of colon 08/2017  ? ?Past Surgical History:  ?Procedure Laterality Date  ? CERVICAL CONIZATION W/BX N/A 06/15/2019  ? Procedure: CONIZATION CERVIX WITH BIOPSY;  Surgeon: [name], MD;  Location: [facility];  Service: Gynecology;  Laterality: N/A;  ? CESAREAN SECTION    ? x 2  ? COLONOSCOPY    ? DILATATION & CURETTAGE/HYSTEROSCOPY WITH MYOSURE N/A 06/15/2019  ? Procedure: [place];  Surgeon: [name], MD;  Location: [facility];  Service: Gynecology;  Laterality: N/A;  ? INTRAUTERINE DEVICE (IUD) INSERTION N/A 06/15/2019  ? Procedure: INTRAUTERINE DEVICE (IUD) INSERTION;  Surgeon: [name], MD;  Location: [place];  Service: Gynecology;  Laterality: N/A;  ? LEEP    ? ?Patient Active Problem List  ? Diagnosis Date Noted  ? Irritable bowel syndrome with constipation 09/09/2021  ? Hip pain, chronic, right 09/09/2021  ? Lumbar pain with radiation down right leg 09/09/2021  ? Change in bowel habits 08/13/2017  ? Bloating 08/13/2017  ? ? ?REFERRING DIAG: M25.551,G89.29 (ICD-10-CM) - Hip pain, chronic, right ? ?THERAPY DIAG:  ?Pain in right hip ? ?Muscle weakness (generalized) ? ?PERTINENT HISTORY: Chronic lumbar pain ? ?PRECAUTIONS: chronic lumbar pain ? ?SUBJECTIVE: "I still feel it a little bit.  It's no  better, no worse."  Pt reports she had soreness after prior Rx.  She has soreness with home exercises but no pain.  Pt reports her husband noticed she is getting around better and not limping as much.  Pt notices the pain when she crosses her leg.  Pt reports reduced stiffness.   ? ? ?PAIN:  ?Are you having pain? Yes ?NPRS scale: 1-2/10 ?Pain location: right lateral hip ?  ? ? ?OBJECTIVE:  ?  ?DIAGNOSTIC FINDINGS:  ?R hip x ray:  IMPRESSION: ?No significant right femoroacetabular osteoarthritis.  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 ?  ?MD note:  Xray pelvis, right hip: ?There is a significant focus of ossification of the tip of the greater trochanter at the gluteus medius insertion ?     ?  ?TODAY'S TREATMENT: ? ?Manual therapy: right hip long axis distraction,  Grade II AP mobs with knee and hip flexed, prone PA in external rotation to improve pain, stiffness, and mobility ? ?Therapeutic Exercise: ?Reviewed response to prior Rx, HEP compliance, pain level, and current function.  ?Reviewed HEP. ?Pt performed: ?S/L hip abd with eccentric focus 2x10 reps ?S/L clamshell 2x10 reps ?Supine: transverse ab brace with bent knee raises and lowers 2 sets of 5 reps ?Modified plank on knees 15 sec hold 5x ?Standing "imaginary splits" isometric 10 sec hold 5x ?See below for pt education. ?  ?  ?PATIENT EDUCATION:  ?Education details: Dx, relevant anatomy, rationale of eccentric exercise, HEP.  PT answered Pt's questions.  ?Person  educated: Patient ?Education method: Explanation, Demonstration, Tactile cues, Verbal cues ?Education comprehension: verbalized understanding, returned demonstration, verbal cues required, tactile cues required, and needs further education ?  ?  ?HOME EXERCISE PROGRAM: ?Access Code: [email or web address] ?URL: [email or web address] ?Date: 10/09/2021 ?Prepared by: Ronny Flurry ?  ?Exercises ?- Sidelying Hip Abduction  - 1 x daily - 5-6 x weekly - 2 sets - 10 reps ?-  Clamshell  - 1 x daily - 5-6 x weekly - 2 sets - 10 reps ?  ?ASSESSMENT: ?  ?CLINICAL IMPRESSION: ?Pt reports reduced stiffness and her husband has noticed her limping less.  Pt performed exercises well with cuing and instruction on correct form.  Pt had no c/o's with exercises or manual therapy.  She responded well to Rx reporting no increased pain after Rx.  Pt should benefit from cont skilled PT services to address goals and to restore PLOF. ?  ?  ?OBJECTIVE IMPAIRMENTS decreased mobility, decreased ROM, decreased strength, hypomobility, and pain.  ?  ?ACTIVITY LIMITATIONS  sitting .  ?  ?PERSONAL FACTORS 1 comorbidity: chronic lumbar pain  are also affecting patient's functional outcome.  ?  ?  ?REHAB POTENTIAL: Good ?  ?CLINICAL DECISION MAKING: Stable/uncomplicated ?  ?EVALUATION COMPLEXITY: Low ?  ?  ?GOALS: ?  ?  ?SHORT TERM GOALS: Target date: 10/30/2021 ?  ?Pt will be independent and compliant with HEP for improved pain, strength, and function.  ?Baseline: ?Goal status: INITIAL ?  ?2.  Pt will report at least a 25% reduction in pain with sitting. ?Baseline:  ?Goal status: INITIAL ?  ?  ?LONG TERM GOALS: Target date: 11/20/2021 ?  ?Pt will demo improved R hip ER and IR strength to 5/5 MMT for improved tolerance with and performance of functional mobility.  ?Baseline:  ?Goal status: INITIAL ?  ?2.  Pt will be able to perform stairs without hip pain. ?Baseline:  ?Goal status: INITIAL ?  ?3.  Pt will be able to sleep without hip pain waking her up. ?Baseline:  ?Goal status: INITIAL ?  ?  ?  ?PLAN: ?PT FREQUENCY: 2x/week ?  ?PT DURATION: 6 weeks ?  ?PLANNED INTERVENTIONS: Therapeutic exercises, Therapeutic activity, Neuromuscular re-education, Gait training, Patient/Family education, Joint mobilization, Aquatic Therapy, Dry Needling, Electrical stimulation, Spinal mobilization, Cryotherapy, Moist heat, Taping, Ultrasound, and Manual therapy ?  ?PLAN FOR NEXT SESSION:  assess response to ex's.  Cont with core and  slow glute strengthening per pt tolerance. manual therapy including STM and jt mobs as needed ? ?Selinda Michaels III PT, DPT ?11/01/21 11:01 AM ? ?  ? ? ? ?   ?

## 2021-10-31 ENCOUNTER — Encounter (HOSPITAL_BASED_OUTPATIENT_CLINIC_OR_DEPARTMENT_OTHER): Payer: Self-pay | Admitting: Physical Therapy

## 2021-10-31 ENCOUNTER — Ambulatory Visit (HOSPITAL_BASED_OUTPATIENT_CLINIC_OR_DEPARTMENT_OTHER): Payer: 59 | Admitting: Physical Therapy

## 2021-10-31 DIAGNOSIS — M6281 Muscle weakness (generalized): Secondary | ICD-10-CM

## 2021-10-31 DIAGNOSIS — M25551 Pain in right hip: Secondary | ICD-10-CM

## 2021-11-05 ENCOUNTER — Ambulatory Visit (HOSPITAL_BASED_OUTPATIENT_CLINIC_OR_DEPARTMENT_OTHER): Payer: 59 | Attending: Orthopaedic Surgery | Admitting: Physical Therapy

## 2021-11-05 ENCOUNTER — Encounter (HOSPITAL_BASED_OUTPATIENT_CLINIC_OR_DEPARTMENT_OTHER): Payer: Self-pay | Admitting: Physical Therapy

## 2021-11-05 DIAGNOSIS — M25551 Pain in right hip: Secondary | ICD-10-CM | POA: Diagnosis present

## 2021-11-05 DIAGNOSIS — M6281 Muscle weakness (generalized): Secondary | ICD-10-CM | POA: Diagnosis present

## 2021-11-05 NOTE — Therapy (Signed)
?OUTPATIENT PHYSICAL THERAPY TREATMENT NOTE ? ? ?Patient Name: Kristen Morgan ?MRN: [id number] ?DOB:October 01, 1975, 46 y.o., female ?Today's Date: 11/06/2021 ? ?PCP: Orma Render, NP ?REFERRING PROVIDER: Vanetta Mulders, MD ? ? ?Visit Number:     4 ?Number of visits:  12 ?Date for PT revaluation:  11/20/2021 ?PT start time:  0854 ?PT end time:  933 ?PT Time:  39 mins ?Activity tolerance:  Pt tolerated treatment well. ?Behavior during therapy:  WFL for tasks performed ? ? ?Past Medical History:  ?Diagnosis Date  ? 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 IBS (irritable bowel syndrome)   ? Thyroid disease   ? Tubular adenoma of colon 08/2017  ? ?Past Surgical History:  ?Procedure Laterality Date  ? CERVICAL CONIZATION W/BX N/A 06/15/2019  ? Procedure: CONIZATION CERVIX WITH BIOPSY;  Surgeon: [name], MD;  Location: [facility];  Service: Gynecology;  Laterality: N/A;  ? CESAREAN SECTION    ? x 2  ? COLONOSCOPY    ? DILATATION & CURETTAGE/HYSTEROSCOPY WITH MYOSURE N/A 06/15/2019  ? Procedure: [place];  Surgeon: [name], MD;  Location: [facility];  Service: Gynecology;  Laterality: N/A;  ? INTRAUTERINE DEVICE (IUD) INSERTION N/A 06/15/2019  ? Procedure: INTRAUTERINE DEVICE (IUD) INSERTION;  Surgeon: [name], MD;  Location: [place];  Service: Gynecology;  Laterality: N/A;  ? LEEP    ? ?Patient Active Problem List  ? Diagnosis Date Noted  ? Irritable bowel syndrome with constipation 09/09/2021  ? Hip pain, chronic, right 09/09/2021  ? Lumbar pain with radiation down right leg 09/09/2021  ? Change in bowel habits 08/13/2017  ? Bloating 08/13/2017  ? ? ?REFERRING DIAG: M25.551,G89.29 (ICD-10-CM) - Hip pain, chronic, right ? ?THERAPY DIAG:  ?Pain in right hip ? ?Muscle weakness (generalized) ? ?PERTINENT HISTORY: Chronic lumbar pain ? ?PRECAUTIONS: chronic lumbar pain ? ?SUBJECTIVE: Pt doesn't have to pause for a minute or  think before she walks.  Pt denies any adverse effects after prior Rx.  She has soreness with home exercises but no pain.  Pt reports her husband noticed she is getting around better and not limping as much.  Pt notices the pain when she crosses her leg.  Pt reports reduced stiffness.   ? ? ?PAIN:  ?Are you having pain? Yes ?NPRS scale: 1/10, not really pain primarily tightness ?Pain location: right lateral hip ?  ? ? ?OBJECTIVE:  ?  ?DIAGNOSTIC FINDINGS:  ?R hip x ray:  IMPRESSION: ?No significant right femoroacetabular osteoarthritis.  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 ?  ?MD note:  Xray pelvis, right hip: ?There is a significant focus of ossification of the tip of the greater trochanter at the gluteus medius insertion ?     ?  ?TODAY'S TREATMENT: ? ?Manual therapy: right hip long axis distraction,  Grade II prone PA in external rotation to improve pain, stiffness, and mobility. ?STM and rolling to L glute, lateral hip, and ITB.   ? ?Therapeutic Exercise: ?Reviewed response to prior Rx, HEP compliance, pain level, and current function.  ?Reviewed HEP. ?Pt performed: ?S/L hip abd with eccentric focus 2x10 reps ?S/L clamshell 2x10 reps with RTB ?Supine: transverse ab brace with bent knee raises and lowers 2 sets of 5 reps ?Modified plank on knees 20 sec hold 5x ?Supine SLR with contralateral shoulder ext with TrA 2x10 reps ?Supine bridge with TrA 2x10 reps ?See below for pt education. ?  ?  ?  PATIENT EDUCATION:  ?Education details: Dx, relevant anatomy, rationale of eccentric exercise, HEP.  Gave pt RTB for S/L clam at home and educated pt with appropriate frequency.   ?Person educated: Patient ?Education method: Explanation, Demonstration, Tactile cues, Verbal cues ?Education comprehension: verbalized understanding, returned demonstration, verbal cues required, tactile cues required, and needs further education ?  ?  ?HOME EXERCISE PROGRAM: ?Access Code: [email or web address] ?URL:  [email or web address] ?Date: 10/09/2021 ?Prepared by: Ronny Flurry ?  ?Exercises ?- Sidelying Hip Abduction  - 1 x daily - 5-6 x weekly - 2 sets - 10 reps ?- Clamshell  - 1 x daily - 5-6 x weekly - 2 sets - 10 reps ?  ?ASSESSMENT: ?  ?CLINICAL IMPRESSION: ?Pt is improving with pain and mobility as evidenced by subjective reports.  Pt performed exercises well with cuing and instruction on correct form.  PT added resistance to S/L clam and progressed core exercises.  Pt tolerated exercises well without c/o's.  Pt had some tenderness with STM to ITB.  She responded well to Rx reporting no increased pain after Rx.  Pt should benefit from cont skilled PT services to address goals and to restore PLOF. ? ?Pt responded well to Rx reporting no increased pain and stating her hip doesn't feel as tight.   ?  ?OBJECTIVE IMPAIRMENTS decreased mobility, decreased ROM, decreased strength, hypomobility, and pain.  ?  ?ACTIVITY LIMITATIONS  sitting .  ?  ?PERSONAL FACTORS 1 comorbidity: chronic lumbar pain  are also affecting patient's functional outcome.  ?  ?  ?REHAB POTENTIAL: Good ?  ?CLINICAL DECISION MAKING: Stable/uncomplicated ?  ?EVALUATION COMPLEXITY: Low ?  ?  ?GOALS: ?  ?  ?SHORT TERM GOALS: Target date: 10/30/2021 ?  ?Pt will be independent and compliant with HEP for improved pain, strength, and function.  ?Baseline: ?Goal status: INITIAL ?  ?2.  Pt will report at least a 25% reduction in pain with sitting. ?Baseline:  ?Goal status: INITIAL ?  ?  ?LONG TERM GOALS: Target date: 11/20/2021 ?  ?Pt will demo improved R hip ER and IR strength to 5/5 MMT for improved tolerance with and performance of functional mobility.  ?Baseline:  ?Goal status: INITIAL ?  ?2.  Pt will be able to perform stairs without hip pain. ?Baseline:  ?Goal status: INITIAL ?  ?3.  Pt will be able to sleep without hip pain waking her up. ?Baseline:  ?Goal status: INITIAL ?  ?  ?  ?PLAN: ?PT FREQUENCY: 2x/week ?  ?PT DURATION: 6 weeks ?   ?PLANNED INTERVENTIONS: Therapeutic exercises, Therapeutic activity, Neuromuscular re-education, Gait training, Patient/Family education, Joint mobilization, Aquatic Therapy, Dry Needling, Electrical stimulation, Spinal mobilization, Cryotherapy, Moist heat, Taping, Ultrasound, and Manual therapy ?  ?PLAN FOR NEXT SESSION:  assess response to ex's.  Cont with core and slow glute strengthening per pt tolerance. manual therapy including STM and jt mobs as needed ? ?Selinda Michaels III PT, DPT ?11/06/21 7:08 AM ? ? ?  ? ? ? ?   ?

## 2021-11-11 NOTE — Therapy (Signed)
?OUTPATIENT PHYSICAL THERAPY TREATMENT NOTE ? ? ?Patient Name: Kristen Morgan ?MRN: [id number] ?DOB:1975/10/04, 46 y.o., female ?Today's Date: 11/11/2021 ? ?PCP: Orma Render, NP ?REFERRING PROVIDER: Vanetta Mulders, MD ? ? ?Visit Number:     5 ?Number of visits:  12 ?Date for PT revaluation:  11/20/2021 ?PT start time:  0853 ?PT end time:  935 ?PT Time:  42 mins ?Activity tolerance:  Pt tolerated treatment well. ?Behavior during therapy:  WFL for tasks performed ? ? ?Past Medical History:  ?Diagnosis Date  ? 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 IBS (irritable bowel syndrome)   ? Thyroid disease   ? Tubular adenoma of colon 08/2017  ? ?Past Surgical History:  ?Procedure Laterality Date  ? CERVICAL CONIZATION W/BX N/A 06/15/2019  ? Procedure: CONIZATION CERVIX WITH BIOPSY;  Surgeon: [name], MD;  Location: [facility];  Service: Gynecology;  Laterality: N/A;  ? CESAREAN SECTION    ? x 2  ? COLONOSCOPY    ? DILATATION & CURETTAGE/HYSTEROSCOPY WITH MYOSURE N/A 06/15/2019  ? Procedure: [place];  Surgeon: [name], MD;  Location: [facility];  Service: Gynecology;  Laterality: N/A;  ? INTRAUTERINE DEVICE (IUD) INSERTION N/A 06/15/2019  ? Procedure: INTRAUTERINE DEVICE (IUD) INSERTION;  Surgeon: [name], MD;  Location: [place];  Service: Gynecology;  Laterality: N/A;  ? LEEP    ? ?Patient Active Problem List  ? Diagnosis Date Noted  ? Irritable bowel syndrome with constipation 09/09/2021  ? Hip pain, chronic, right 09/09/2021  ? Lumbar pain with radiation down right leg 09/09/2021  ? Change in bowel habits 08/13/2017  ? Bloating 08/13/2017  ? ? ?REFERRING DIAG: M25.551,G89.29 (ICD-10-CM) - Hip pain, chronic, right ? ?THERAPY DIAG:  ?Pain in right hip ? ?Muscle weakness (generalized) ? ?ONSET DATE: November 2021 ? ?PERTINENT HISTORY: Chronic lumbar pain ? ?PRECAUTIONS: chronic lumbar pain ? ?SUBJECTIVE: Pt doesn't  have to pause for a minute or think before she walks.  Pt states she is improving and doesn't notice it as much during the day with her normal activities.  Pt denies any adverse effects after prior Rx.  She reports less soreness with HEP.   Pt reports her husband noticed she is getting around better and not limping as much.  Pt reports she has pain after sitting for awhile including sitting at a soccer game.  Pt states she felt a pinch when standing up which went away quickly.  Pt notices the pain when she crosses her leg.  Pt reports reduced stiffness.   ? ?     ? ?PAIN:  ?Are you having pain? Yes ?NPRS scale: 1-2/10 ?Pain location: right lateral hip ?  ? ? ?OBJECTIVE:  ?  ?DIAGNOSTIC FINDINGS:  ?R hip x ray:  IMPRESSION: ?No significant right femoroacetabular osteoarthritis.  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 ?  ?MD note:  Xray pelvis, right hip: ?There is a significant focus of ossification of the tip of the greater trochanter at the gluteus medius insertion ?     ?  ?TODAY'S TREATMENT: ? ?Manual therapy:   ?STM and rolling to R glute, lateral hip, and ITB to improve tightness and pain.   ? ?Therapeutic Exercise: ?Reviewed response to prior Rx, HEP compliance, pain level, and current function.  ?Reviewed and Updated HEP. ?Pt performed: ?S/L hip abd with eccentric focus 2x10 reps ?S/L clamshell 2x10 reps with RTB ?Supine SLR with contralateral shoulder ext  with TrA 2x10 reps ?Supine bridge with TrA x10 reps and 2x10 reps with R LE hover ?Sidestepping x 3 laps ?See below for pt education. ?  ?  ?PATIENT EDUCATION:  ?Education details:  Reviewed and updated HEP.  Gave pt a HEP handout and educated pt in correct form and appropriate frequency. rationale of eccentric exercise.  Gave pt RTB for S/L clam at home. ?Person educated: Patient ?Education method: Explanation, Demonstration, Tactile cues, Verbal cues ?Education comprehension: verbalized understanding, returned demonstration,  verbal cues required, tactile cues required, and needs further education ?  ?  ?HOME EXERCISE PROGRAM: ?Access Code: [email or web address] ?URL: [email or web address] ? ?  ?Added Exercises to HEP: ?- Supine Bridge  - 1 x daily - 5-6 x weekly - 2 sets - 10 reps ?- Side Stepping with Counter Support  - 1 x daily - 5-6 x weekly - 2 sets - 10 reps ?  ?ASSESSMENT: ?  ?CLINICAL IMPRESSION: ?Pt is progressing well with pain, sx's, and function.  PT progressed intensity of exercises and Pt tolerated progression well.  PT updated HEP and pt demonstrates good understanding.  Pt is improving with strength.  Pt has improved soft tissue tightness t/o glute and ITB.  Pt responded well to Rx reporting no increased pain after Rx.  Pt should benefit from cont skilled PT services to address goals and to restore PLOF.   ? ?  ?OBJECTIVE IMPAIRMENTS decreased mobility, decreased ROM, decreased strength, hypomobility, and pain.  ?  ?ACTIVITY LIMITATIONS  sitting .  ?  ?PERSONAL FACTORS 1 comorbidity: chronic lumbar pain  are also affecting patient's functional outcome.  ?  ?  ?REHAB POTENTIAL: Good ?  ?CLINICAL DECISION MAKING: Stable/uncomplicated ?  ?EVALUATION COMPLEXITY: Low ?  ?  ?GOALS: ?  ?  ?SHORT TERM GOALS: Target date: 10/30/2021 ?  ?Pt will be independent and compliant with HEP for improved pain, strength, and function.  ?Baseline: ?Goal status: INITIAL ?  ?2.  Pt will report at least a 25% reduction in pain with sitting. ?Baseline:  ?Goal status: INITIAL ?  ?  ?LONG TERM GOALS: Target date: 11/20/2021 ?  ?Pt will demo improved R hip ER and IR strength to 5/5 MMT for improved tolerance with and performance of functional mobility.  ?Baseline:  ?Goal status: INITIAL ?  ?2.  Pt will be able to perform stairs without hip pain. ?Baseline:  ?Goal status: INITIAL ?  ?3.  Pt will be able to sleep without hip pain waking her up. ?Baseline:  ?Goal status: INITIAL ?  ?  ?  ?PLAN: ?PT FREQUENCY: 2x/week ?  ?PT DURATION: 6 weeks ?   ?PLANNED INTERVENTIONS: Therapeutic exercises, Therapeutic activity, Neuromuscular re-education, Gait training, Patient/Family education, Joint mobilization, Aquatic Therapy, Dry Needling, Electrical stimulation, Spinal mobilization, Cryotherapy, Moist heat, Taping, Ultrasound, and Manual therapy ?  ?PLAN FOR NEXT SESSION:  assess response to ex's.  Cont with core and slow glute strengthening per pt tolerance. manual therapy including STM and jt mobs as needed ? ?Selinda Michaels III PT, DPT ?11/13/21 10:17 AM ? ? ? ?  ? ? ? ?   ?

## 2021-11-12 ENCOUNTER — Encounter (HOSPITAL_BASED_OUTPATIENT_CLINIC_OR_DEPARTMENT_OTHER): Payer: Self-pay | Admitting: Physical Therapy

## 2021-11-12 ENCOUNTER — Ambulatory Visit (HOSPITAL_BASED_OUTPATIENT_CLINIC_OR_DEPARTMENT_OTHER): Payer: 59 | Admitting: Physical Therapy

## 2021-11-12 DIAGNOSIS — M6281 Muscle weakness (generalized): Secondary | ICD-10-CM

## 2021-11-12 DIAGNOSIS — M25551 Pain in right hip: Secondary | ICD-10-CM | POA: Diagnosis not present

## 2021-11-19 ENCOUNTER — Ambulatory Visit (HOSPITAL_BASED_OUTPATIENT_CLINIC_OR_DEPARTMENT_OTHER): Payer: 59 | Admitting: Physical Therapy

## 2021-11-19 DIAGNOSIS — M6281 Muscle weakness (generalized): Secondary | ICD-10-CM

## 2021-11-19 DIAGNOSIS — M25551 Pain in right hip: Secondary | ICD-10-CM | POA: Diagnosis not present

## 2021-11-19 NOTE — Therapy (Signed)
?OUTPATIENT PHYSICAL THERAPY TREATMENT NOTE ? ? ?Patient Name: Ichelle Harral ?MRN: [id number] ?DOB:[birth year], 46 y.o., female ?Today's Date: 11/20/2021 ? ?PCP: Orma Render, NP ?REFERRING PROVIDER: Vanetta Mulders, MD ? ? ?Visit Number:     0 ?Number of visits:  12 ?Date for PT revaluation:  11/20/2021 ?PT start time:  0852 ?PT end time:  934 ?PT Time:  42 mins ?Activity tolerance:  Pt tolerated treatment well. ?Behavior during therapy:  WFL for tasks performed ? ? ?Past Medical History:  ?Diagnosis Date  ? 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 IBS (irritable bowel syndrome)   ? Thyroid disease   ? Tubular adenoma of colon 08/2017  ? ?Past Surgical History:  ?Procedure Laterality Date  ? CERVICAL CONIZATION W/BX N/A 06/15/2019  ? Procedure: CONIZATION CERVIX WITH BIOPSY;  Surgeon: [name], MD;  Location: [facility];  Service: Gynecology;  Laterality: N/A;  ? CESAREAN SECTION    ? x 2  ? COLONOSCOPY    ? DILATATION & CURETTAGE/HYSTEROSCOPY WITH MYOSURE N/A 06/15/2019  ? Procedure: [place];  Surgeon: [name], MD;  Location: [facility];  Service: Gynecology;  Laterality: N/A;  ? INTRAUTERINE DEVICE (IUD) INSERTION N/A 06/15/2019  ? Procedure: INTRAUTERINE DEVICE (IUD) INSERTION;  Surgeon: [name], MD;  Location: [place];  Service: Gynecology;  Laterality: N/A;  ? LEEP    ? ?Patient Active Problem List  ? Diagnosis Date Noted  ? Irritable bowel syndrome with constipation 09/09/2021  ? Hip pain, chronic, right 09/09/2021  ? Lumbar pain with radiation down right leg 09/09/2021  ? Change in bowel habits 08/13/2017  ? Bloating 08/13/2017  ? ? ?REFERRING DIAG: M25.551,G89.29 (ICD-10-CM) - Hip pain, chronic, right ? ?THERAPY DIAG:  ?Pain in right hip ? ?Muscle weakness (generalized) ? ?ONSET DATE: November 2021 ? ?PERTINENT HISTORY: Chronic lumbar pain ? ?PRECAUTIONS: chronic lumbar pain ? ?SUBJECTIVE: Pt doesn't  have to pause for a minute or think before she walks.  Pt states she is improving and doesn't notice it as much during the day with her normal activities.  Pt reports improved sx's with standing.  Pt has stiffness and discomfort with prolonged sitting especially in uncomfortable seats including at baseball games. Pt denies any adverse effects after prior Rx.  She reports less soreness with HEP.   Pt reports her husband noticed she is getting around better and not limping as much.  Pt notices the pain when she crosses her leg.  Pt reports reduced stiffness.  Pt reports improved sleeping and not waking up as much as she was.  Pt reports she is not using ice and heat as much.  Pt reports 80% improvement in pain and sx's.   ? ?     ? ?PAIN:  ?Are you having pain?No ?NPRS scale: 0/10  ?Pain location: right lateral hip ?  ? ? ?OBJECTIVE:  ?  ?DIAGNOSTIC FINDINGS:  ?R hip x ray:  IMPRESSION: ?No significant right femoroacetabular osteoarthritis.  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 ?  ?MD note:  Xray pelvis, right hip: ?There is a significant focus of ossification of the tip of the greater trochanter at the gluteus medius insertion ?     ?  ?TODAY'S TREATMENT: ? ?Manual therapy:   ?Pt received right hip long axis distraction in supine ?STM and rolling to R glute, lateral hip, and ITB to improve tightness and pain.   ? ?Therapeutic Exercise: ?Reviewed response  to prior Rx, HEP compliance, pain level, and current function.  ?Reviewed and Updated HEP. ?Pt performed: ?S/L clamshell 2x10 reps with RTB ?Supine SLR with contralateral shoulder ext with TrA 2x10 reps ?Supine bridge with TrA x10 reps with R LE hover and 2x10 reps with L knee extension ?Sidestepping x 3 laps ?Mini squats with TrA 2x10 reps ?See below for pt education. ?  ?  ?PATIENT EDUCATION:  ?Education details: POC, HEP, exercise form.  PT answered pt's questions. ?Person educated: Patient ?Education method: Explanation,  Demonstration, Tactile cues, Verbal cues ?Education comprehension: verbalized understanding, returned demonstration, verbal cues required, tactile cues required ?  ?  ?HOME EXERCISE PROGRAM: ?Access Code: [email or web address] ?URL: [email or web address] ? ?  ?  ?ASSESSMENT: ?  ?CLINICAL IMPRESSION: ?Pt is making great progress.  She reports improved sleeping and an 80% improvement in pain and sx's overall.  Pt has met all of her STG's.  Pt performed exercises well and was challenged with supine bridge with knee extension.  She is improving with strength.  Pt responded well to Rx having no pain after Rx.  Pt should benefit from cont skilled PT services to address goals and to restore PLOF.   ? ?  ?OBJECTIVE IMPAIRMENTS decreased mobility, decreased ROM, decreased strength, hypomobility, and pain.  ?  ?ACTIVITY LIMITATIONS  sitting .  ?  ?PERSONAL FACTORS 1 comorbidity: chronic lumbar pain  are also affecting patient's functional outcome.  ?  ?  ?REHAB POTENTIAL: Good ?  ?CLINICAL DECISION MAKING: Stable/uncomplicated ?  ?EVALUATION COMPLEXITY: Low ?  ?  ?GOALS: ?  ?  ?SHORT TERM GOALS: Target date: 10/30/2021 ?  ?Pt will be independent and compliant with HEP for improved pain, strength, and function.  ?Baseline: ?Goal status: GOAL MET ?  ?2.  Pt will report at least a 25% reduction in pain with sitting. ?Baseline:  ?Goal status: GOAL MET ?  ?  ?LONG TERM GOALS: Target date: 11/20/2021 ?  ?Pt will demo improved R hip ER and IR strength to 5/5 MMT for improved tolerance with and performance of functional mobility.  ?Baseline:  ?Goal status: INITIAL ?  ?2.  Pt will be able to perform stairs without hip pain. ?Baseline:  ?Goal status: INITIAL ?  ?3.  Pt will be able to sleep without hip pain waking her up. ?Baseline:  ?Goal status: INITIAL ?  ?  ?  ?PLAN: ?PT FREQUENCY: 2x/week ?  ?PT DURATION: 6 weeks ?  ?PLANNED INTERVENTIONS: Therapeutic exercises, Therapeutic activity, Neuromuscular re-education, Gait training,  Patient/Family education, Joint mobilization, Aquatic Therapy, Dry Needling, Electrical stimulation, Spinal mobilization, Cryotherapy, Moist heat, Taping, Ultrasound, and Manual therapy ?  ?PLAN FOR NEXT SESSION:  PN next visit. Recert vs discharge.  cont with core and slow glute strengthening per pt tolerance. manual therapy including STM and jt mobs as needed ? ?Selinda Michaels III PT, DPT ?11/20/21 10:06 AM ? ? ? ? ?  ? ? ? ?   ?

## 2021-11-20 ENCOUNTER — Encounter (HOSPITAL_BASED_OUTPATIENT_CLINIC_OR_DEPARTMENT_OTHER): Payer: Self-pay | Admitting: Physical Therapy

## 2021-11-26 ENCOUNTER — Encounter (HOSPITAL_BASED_OUTPATIENT_CLINIC_OR_DEPARTMENT_OTHER): Payer: Self-pay | Admitting: Physical Therapy

## 2021-11-26 ENCOUNTER — Ambulatory Visit (HOSPITAL_BASED_OUTPATIENT_CLINIC_OR_DEPARTMENT_OTHER): Payer: 59 | Admitting: Physical Therapy

## 2021-11-26 DIAGNOSIS — M25551 Pain in right hip: Secondary | ICD-10-CM | POA: Diagnosis not present

## 2021-11-26 DIAGNOSIS — M6281 Muscle weakness (generalized): Secondary | ICD-10-CM

## 2021-11-26 NOTE — Therapy (Signed)
OUTPATIENT PHYSICAL THERAPY TREATMENT NOTE   Patient Name: Kristen Morgan MRN: [id number] DOB:July 30, 1975, 46 y.o., female Today's Date: 11/26/2021  PCP: Orma Render, NP REFERRING PROVIDER: Vanetta Mulders, MD   Visit Number:     7 Number of visits:  7 PT start time:  0858 PT end time:  0937 PT Time:  39 mins Activity tolerance:  Pt tolerated treatment well. Behavior during therapy:  WFL for tasks performed   Past Medical History:  Diagnosis Date   Arthritis    back/ right hip   IBS (irritable bowel syndrome)    Thyroid disease    Tubular adenoma of colon 08/2017   Past Surgical History:  Procedure Laterality Date   CERVICAL CONIZATION W/BX N/A 06/15/2019   Procedure: CONIZATION CERVIX WITH BIOPSY;  Surgeon: [name], MD;  Location: [place];  Service: Gynecology;  Laterality: N/A;   CESAREAN SECTION     x 2   COLONOSCOPY     DILATATION & CURETTAGE/HYSTEROSCOPY WITH MYOSURE N/A 06/15/2019   Procedure: DILATATION & CURETTAGE/HYSTEROSCOPY WITH MYOSURE;  Surgeon: [name], MD;  Location: [place];  Service: Gynecology;  Laterality: N/A;   INTRAUTERINE DEVICE (IUD) INSERTION N/A 06/15/2019   Procedure: INTRAUTERINE DEVICE (IUD) INSERTION;  Surgeon: [name], MD;  Location: [place];  Service: Gynecology;  Laterality: N/A;   LEEP     Patient Active Problem List   Diagnosis Date Noted   Irritable bowel syndrome with constipation 09/09/2021   Hip pain, chronic, right 09/09/2021   Lumbar pain with radiation down right leg 09/09/2021   Change in bowel habits 08/13/2017   Bloating 08/13/2017    REFERRING DIAG: M25.551,G89.29 (ICD-10-CM) - Hip pain, chronic, right  THERAPY DIAG:  Pain in right hip  Muscle weakness (generalized)  ONSET DATE: November 2021  PERTINENT HISTORY: Chronic lumbar pain  PRECAUTIONS: chronic lumbar pain  SUBJECTIVE: Pt denies any adverse effects after prior Rx.   Pt reports less pain and stiffness.  She has less hesitation with initiating ambulation after sitting.  She was having pain when getting off couch but not now.  Pt reports 90% improvement overall in pain and sx's.  Pt states she hasn't noticed pain with performing stairs.  Pt doesn't have any issues with sleeping now.  Pt is able to perform her walking program without R LE discomfort.        PAIN:  NPRS scale: 0-1/10, worst: 2/10  Pain location: right lateral hip     OBJECTIVE:    DIAGNOSTIC FINDINGS:  R hip x ray:  IMPRESSION: No significant right femoroacetabular osteoarthritis.  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   MD note:  Xray pelvis, right hip: There is a significant focus of ossification of the tip of the greater trochanter at the gluteus medius insertion   TODAY'S TREATMENT  PATIENT SURVEYS:  FOTO 77 which has improved from prior 61.  Goal of 71 at visit #12                         PALPATION: TTP:  R greater trochanter   LE ROM:   STRENGTH Right  Left   Hip flexion 5/5 5/5  Hip extension 5/5 5/5  Hip abduction 5/5 5/5  Hip adduction 5/5 5/5  Hip internal rotation 5/5 5/5  Hip external rotation 5/5 5/5  Knee flexion 5/5 5/5  Knee extension 5/5 5/5  Ankle dorsiflexion  Ankle plantarflexion      Ankle inversion      Ankle eversion       (Blank rows = not tested)   LE AROM:   AROM Right  Left   Hip flexion      Hip extension WNL WNL  Hip abduction [facility]  Hip adduction WNL WNL  Hip internal rotation 25 26  Hip external rotation 28 30  Knee flexion      Knee extension      Ankle dorsiflexion      Ankle plantarflexion      Ankle inversion      Ankle eversion       (Blank rows = not tested)   LOWER EXTREMITY SPECIAL TESTS:  FABER'S TEST:  Negative bilat       GAIT: Assistive device utilized: None Level of assistance: Complete Independence Comments:  Pt ambulated with a normalized heel to toe gait  pattern without limping.    Therapeutic Exercise: -Reviewed response to prior Rx, HEP compliance, pain level, and current function.  -Reviewed and Updated HEP.  Gave pt a HEP handout.  Educated pt with progression of HEP and what exercises to discontinue when progressing.  Educated pt in appropriate frequency of exercises.   -Pt performed: Supine bridge with TrA  2x10 reps with L knee extension Sidestepping with RTB 2x10 reps Mini squats with TrA x10 reps -See below for pt education.     PATIENT EDUCATION:  Education details: POC, HEP, exercise form.  PT answered pt's questions.  PT educated pt in self STM with tennis ball. Person educated: Patient Education method: Explanation, Demonstration, Tactile cues, Verbal cues, handout Education comprehension: verbalized understanding, returned demonstration, verbal cues required, tactile cues required     HOME EXERCISE PROGRAM: Access Code: [email or web address] URL: [email or web address]      ASSESSMENT:   CLINICAL IMPRESSION: Pt has made excellent progress in all areas.  Pt reports 90% improvement overall in pain and sx's.  Pt states she hasn't noticed pain with performing stairs and is performing her walking program without discomfort.  She doesn't have any issues with sleeping now.  Pt demonstrates a significant improvement in self perceived disability with FOTO score improving from 61 initially to 77 currently.  Pt met her FOTO goal.  She demonstrates improved R hip ER and IR strength which equals the L side being 5/5 MMT.  PT spent time reviewing HEP and progressing HEP.  Educated pt how to progress home exercises appropriately and also the appropriate frequency.  She is independent with HEP.  Pt has met all of her goals and is ready for discharge.     OBJECTIVE IMPAIRMENTS decreased mobility, decreased ROM, decreased strength, hypomobility, and pain.    ACTIVITY LIMITATIONS  sitting .    PERSONAL FACTORS 1 comorbidity: chronic  lumbar pain  are also affecting patient's functional outcome.      REHAB POTENTIAL: Good   CLINICAL DECISION MAKING: Stable/uncomplicated   EVALUATION COMPLEXITY: Low     GOALS:     SHORT TERM GOALS: Target date: 10/30/2021   Pt will be independent and compliant with HEP for improved pain, strength, and function.  Baseline: Goal status: GOAL MET   2.  Pt will report at least a 25% reduction in pain with sitting. Baseline:  Goal status: GOAL MET     LONG TERM GOALS: Target date: 11/20/2021   Pt will demo improved R hip ER and IR strength to 5/5 MMT for improved tolerance with and performance  of functional mobility.  Baseline:  Goal status: GOAL MET   2.  Pt will be able to perform stairs without hip pain. Baseline:  Goal status: GOAL MET   3.  Pt will be able to sleep without hip pain waking her up. Baseline:  Goal status: GOAL MET       PLAN: PT FREQUENCY: 1 visit   PT DURATION: 1 visit   PLANNED INTERVENTIONS: Therapeutic exercises, Therapeutic activity, Neuromuscular re-education, Gait training, Patient/Family education, Joint mobilization, Aquatic Therapy, Dry Needling, Electrical stimulation, Spinal mobilization, Cryotherapy, Moist heat, Taping, Ultrasound, and Manual therapy   PLAN FOR NEXT SESSION:  Pt to be discharged from skilled PT services due to meeting all goals.  She is agreeable with discharge.  She will cont with HEP.   PHYSICAL THERAPY DISCHARGE SUMMARY  Visits from Start of Care: 7  Current functional level related to goals / functional outcomes: See above   Remaining deficits: See above   Education / Equipment: See above   Patient agrees to discharge. Patient goals were met. Patient is being discharged due to meeting the stated rehab goals.  Selinda Michaels III PT, DPT 11/26/21 7:48 PM

## 2022-01-06 ENCOUNTER — Encounter (HOSPITAL_BASED_OUTPATIENT_CLINIC_OR_DEPARTMENT_OTHER): Payer: Self-pay | Admitting: Nurse Practitioner

## 2022-01-06 ENCOUNTER — Ambulatory Visit (INDEPENDENT_AMBULATORY_CARE_PROVIDER_SITE_OTHER): Payer: 59 | Admitting: Nurse Practitioner

## 2022-01-06 VITALS — BP 122/82 | HR 78 | Ht 64.0 in | Wt 173.9 lb

## 2022-01-06 DIAGNOSIS — R140 Abdominal distension (gaseous): Secondary | ICD-10-CM | POA: Diagnosis not present

## 2022-01-06 DIAGNOSIS — K5909 Other constipation: Secondary | ICD-10-CM

## 2022-01-06 DIAGNOSIS — K581 Irritable bowel syndrome with constipation: Secondary | ICD-10-CM | POA: Diagnosis not present

## 2022-01-06 DIAGNOSIS — Z0000 Encounter for general adult medical examination without abnormal findings: Secondary | ICD-10-CM

## 2022-01-06 MED ORDER — METOCLOPRAMIDE HCL 10 MG PO TABS: 5.0000 mg | ORAL_TABLET | ORAL | 3 refills | Status: DC | PRN

## 2022-01-06 NOTE — Patient Instructions (Addendum)
I recommend trying a clean-out of the bowels with Miralax or magnesium citrate once a week (or longer depending on how you are feeling). Typically you can try three doses of miralax in one day or one bottle of magnesium citrate to get the bowels cleansed. If this is not helpful, please let me know.   I have sent in a medication called Reglan to help with the constipation. You can start with this once a day and increase to three times a day as needed. We can see if this is helpful to move the bowels more quickly.

## 2022-01-06 NOTE — Progress Notes (Signed)
BP 122/82   Pulse 78   Ht '5\' 4"'$  (1.626 m)   Wt 173 lb 14.4 oz (78.9 kg)   SpO2 92%   BMI 29.85 kg/m    Subjective:    Patient ID: Kristen Morgan, female    DOB: January 27, 1976, 46 y.o.   MRN: [id number]  HPI: Kristen Morgan is a 46 y.o. female presenting on 01/06/2022 for comprehensive medical examination.   Current medical concerns include: Chronic constipation -Bridney endorses feelings of incomplete emptying, increased periods of time between bowel movements, abdominal pain, nausea, feelings of fullness, and small stools periodically  -She does report that eventually she will have a very large bowel movement after several days with the above symptoms.   - She tells me that she feels as though her stool is just getting backed up in her intestines  -She recently had a colonoscopy and reports that after the procedure she felt her bowels were working well with no sensation of increased fullness for approximately 3 months but slowly symptoms returned  -When she does have a bowel movement she denies hard or painful stools  -She tells me on days that she has not had a bowel movement she often awakes with nausea and extreme fullness  -She denies fever, chills, bloody stools   She reports regular vision exams q1-5y: yes She reports regular dental exams q 79m yes Her diet consists of:  primarily healthy options with well balanced meals She endorses exercise and/or activity of:  walking 5 hours a week She works in:  sHerbalist(computer work)  She endorses ETOH use ( 2/wk on average ) She denies nictoine use  She denies illegal substance use   She reports some mild spotting but no heavy bleeding following IUD.  She is currently sexually active  She denies concerns today about STI  She denies concerns about skin changes today  She endorses concerns about bowel changes today (see above) She denies concerns about bladder changes today   Most Recent Depression Screen:      01/06/2022    9:32 AM 09/09/2021    6:18 PM 07/27/2017    7:49 AM  Depression screen PHQ 2/9  Decreased Interest 0 0 0  Down, Depressed, Hopeless 1 0 0  PHQ - 2 Score 1 0 0  Altered sleeping 1 1   Tired, decreased energy 1 1   Change in appetite 1 0   Feeling bad or failure about yourself  0 0   Trouble concentrating 0 1   Moving slowly or fidgety/restless 0 0   Suicidal thoughts 0 0   PHQ-9 Score 4 3   Difficult doing work/chores Not difficult at all Not difficult at all    Most Recent Anxiety Screen:     09/09/2021    6:19 PM  GAD 7 : Generalized Anxiety Score  Nervous, Anxious, on Edge 1  Control/stop worrying 0  Worry too much - different things 0  Trouble relaxing 0  Restless 0  Easily annoyed or irritable 1  Afraid - awful might happen 0  Total GAD 7 Score 2  Anxiety Difficulty Not difficult at all   Most Recent Fall Screen:    01/06/2022    9:32 AM 09/09/2021    6:18 PM  [place] the past year? 0 0  Number falls in past yr: 0 0  Injury with Fall? 0 0  Risk for fall due to : Orthopedic patient No Fall Risks  Follow up Falls evaluation completed;Education provided Falls evaluation completed    All ROS negative except what is listed above and in the HPI.   Past medical history, surgical history, medications, allergies, family history and social history reviewed with patient today and changes made to appropriate areas of the chart.  Past Medical History:  Past Medical History:  Diagnosis Date   Arthritis    back/ right hip   IBS (irritable bowel syndrome)    Thyroid disease    Tubular adenoma of colon 08/2017   Medications:  Current Outpatient Medications on File Prior to Visit  Medication Sig   Azelastine HCl 137 MCG/SPRAY SOLN 1 puff in each nostril   liothyronine (CYTOMEL) 5 MCG tablet Take 5 mcg by mouth 2 (two) times daily.   Multiple Vitamin (MULTIVITAMIN) tablet Take 1 tablet by mouth daily.   Probiotic Product (PROBIOTIC-10 PO) Take 1  capsule by mouth daily.   SYNTHROID 50 MCG tablet Take 50 mcg by mouth daily.   No current facility-administered medications on file prior to visit.   Surgical History:  Past Surgical History:  Procedure Laterality Date   CERVICAL CONIZATION W/BX N/A 06/15/2019   Procedure: CONIZATION CERVIX WITH BIOPSY;  Surgeon: [name], MD;  Location: [place];  Service: Gynecology;  Laterality: N/A;   CESAREAN SECTION     x 2   COLONOSCOPY     DILATATION & CURETTAGE/HYSTEROSCOPY WITH MYOSURE N/A 06/15/2019   Procedure: DILATATION & CURETTAGE/HYSTEROSCOPY WITH MYOSURE;  Surgeon: [name], MD;  Location: [facility];  Service: Gynecology;  Laterality: N/A;   INTRAUTERINE DEVICE (IUD) INSERTION N/A 06/15/2019   Procedure: INTRAUTERINE DEVICE (IUD) INSERTION;  Surgeon: [name], MD;  Location: [facility];  Service: Gynecology;  Laterality: N/A;   LEEP     Allergies:  Allergies  Allergen Reactions   Sulfa Antibiotics Hives and Rash   Social History:  Social History   Socioeconomic History   Marital status: Married    Spouse name: Not on file   Number of children: 2   Years of education: Not on file   Highest education level: Not on file  Occupational History   Not on file  Tobacco Use   Smoking status: Former    Types: Cigarettes    Quit date: 07/07/2008    Years since quitting: 13.5   Smokeless tobacco: Never  Vaping Use   Vaping Use: Never used  Substance and Sexual Activity   Alcohol use: Yes    Comment: 3 drinks/week, combination of wine, beer, and liquor   Drug use: No   Sexual activity: Yes    Birth control/protection: I.U.D.  Other Topics Concern   Not on file  Social History Narrative   Not on file   Social Determinants of Health   Financial Resource Strain: Not on file  Food Insecurity: Not on file  Transportation Needs: Not on file  Physical Activity: Not on file  Stress: Not on file  Social  Connections: Not on file  Intimate Partner Violence: Not on file   Social History   Tobacco Use  Smoking Status Former   Types: Cigarettes   Quit date: 07/07/2008   Years since quitting: 13.5  Smokeless Tobacco Never   Social History   Substance and Sexual Activity  Alcohol Use Yes   Comment: 3 drinks/week, combination of wine, beer, and liquor   Family History:  Family History  Problem Relation Age of Onset   Thyroid disease Mother  Arthritis Father    Thyroid disease Maternal Aunt    Diabetes Paternal Uncle    Thyroid disease Maternal Grandmother    Colon cancer Maternal Grandfather    Diabetes Paternal Grandmother    Breast cancer Neg Hx    Esophageal cancer Neg Hx    Rectal cancer Neg Hx    Stomach cancer Neg Hx        Objective:    BP 122/82   Pulse 78   Ht '5\' 4"'$  (1.626 m)   Wt 173 lb 14.4 oz (78.9 kg)   SpO2 92%   BMI 29.85 kg/m   Wt Readings from Last 3 Encounters:  01/06/22 173 lb 14.4 oz (78.9 kg)  09/06/21 174 lb 11.2 oz (79.2 kg)  08/13/21 166 lb (75.3 kg)    Physical Exam Vitals and nursing note reviewed.  Constitutional:      General: She is not in acute distress.    Appearance: Normal appearance.  HENT:     Head: Normocephalic and atraumatic.     Right Ear: Hearing, tympanic membrane, ear canal and external ear normal.     Left Ear: Hearing, tympanic membrane, ear canal and external ear normal.     Nose: Nose normal.     Right Sinus: No maxillary sinus tenderness or frontal sinus tenderness.     Left Sinus: No maxillary sinus tenderness or frontal sinus tenderness.     Mouth/Throat:     Lips: Pink.     Mouth: Mucous membranes are moist.     Pharynx: Oropharynx is clear.  Eyes:     General: Lids are normal. Vision grossly intact.     Extraocular Movements: Extraocular movements intact.     Conjunctiva/sclera: Conjunctivae normal.     Pupils: Pupils are equal, round, and reactive to light.     Funduscopic exam:    Right eye: Red  reflex present.        Left eye: Red reflex present.    Visual Fields: Right eye visual fields normal and left eye visual fields normal.  Neck:     Thyroid: No thyromegaly.     Vascular: No carotid bruit.  Cardiovascular:     Rate and Rhythm: Normal rate and regular rhythm.     Chest Wall: PMI is not displaced.     Pulses: Normal pulses.          Dorsalis pedis pulses are 2+ on the right side and 2+ on the left side.       Posterior tibial pulses are 2+ on the right side and 2+ on the left side.     Heart sounds: Normal heart sounds. No murmur heard. Pulmonary:     Effort: Pulmonary effort is normal. No respiratory distress.     Breath sounds: Normal breath sounds.  Abdominal:     General: Abdomen is flat. Bowel sounds are normal. There is no distension.     Palpations: Abdomen is soft. There is no hepatomegaly, splenomegaly or mass.     Tenderness: There is no abdominal tenderness. There is no right CVA tenderness, left CVA tenderness, guarding or rebound.  Musculoskeletal:        General: Normal range of motion.     Cervical back: Full passive range of motion without pain, normal range of motion and neck supple. No tenderness.     Right lower leg: No edema.     Left lower leg: No edema.  Feet:     Left foot:  Toenail Condition: Left toenails are normal.  Lymphadenopathy:     Cervical: No cervical adenopathy.     Upper Body:     Right upper body: No supraclavicular adenopathy.     Left upper body: No supraclavicular adenopathy.  Skin:    General: Skin is warm and dry.     Capillary Refill: Capillary refill takes less than 2 seconds.     Nails: There is no clubbing.  Neurological:     General: No focal deficit present.     Mental Status: She is alert and oriented to person, place, and time.     GCS: GCS eye subscore is 4. GCS verbal subscore is 5. GCS motor subscore is 6.     Sensory: Sensation is intact.     Motor: Motor function is intact.     Coordination:  Coordination is intact.     Gait: Gait is intact.     Deep Tendon Reflexes: Reflexes are normal and symmetric.  Psychiatric:        Attention and Perception: Attention normal.        Mood and Affect: Mood normal.        Speech: Speech normal.        Behavior: Behavior normal. Behavior is cooperative.        Thought Content: Thought content normal.        Cognition and Memory: Cognition and memory normal.        Judgment: Judgment normal.     Results for orders placed or performed during the hospital encounter of 06/15/19  CBC  Result Value Ref Range   WBC 7.8 4.0 - 10.5 K/uL   RBC 4.39 3.87 - 5.11 MIL/uL   Hemoglobin 14.0 12.0 - 15.0 g/dL   HCT 41.7 36.0 - 46.0 %   MCV 95.0 80.0 - 100.0 fL   MCH 31.9 26.0 - 34.0 pg   MCHC 33.6 30.0 - 36.0 g/dL   RDW 12.3 11.5 - 15.5 %   Platelets 263 150 - 400 K/uL   nRBC 0.0 0.0 - 0.2 %  Pregnancy, urine POC  Result Value Ref Range   Preg Test, Ur NEGATIVE NEGATIVE  Surgical pathology  Result Value Ref Range   SURGICAL PATHOLOGY      SURGICAL PATHOLOGY CASE: [id number] PATIENT: Cyd Silence Surgical Pathology Report     Clinical History: Menorrhagia, high grade pap smear, fibroids (cm)   FINAL MICROSCOPIC DIAGNOSIS:  A. ENDOMETRIUM, CURETTAGE: -  Proliferative endometrium with exogenous hormone effect -  Features suggestive of endometrial polyp -  Features suggestive of adenomyosis -  No hyperplasia or malignancy identified  B. 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  Entirely submitted in 4 blocks.  Specimen B: Received in formalin is a 2 cm in diameter and up to 1 cm deep and 0.5 cm thick  cervical cone with a suture at 12:00.  Ectocervix is pink-white, s mooth, focally disrupted and the endocervix is tan-red and varies from smooth to granular/trabeculated.  The endocervical edge is inked orange, deep margin black, and the specimen is entirely submitted as follows: Block 1 = 12-3 o'clock Block 2 = 3-6 o'clock Block 3 =  6-9 o'clock Block 4 = 9-12 o'clock  SW 06/16/2019   Final Diagnosis performed by Thressa Sheller, MD.   Electronically signed 06/17/2019 Technical component performed at [facility], [place] [address], [place], [place] [address].  Professional component performed at [employer]. [facility], [place] [address] [place], [place] [address].  Immunohistochemistry Technical component (if applicable) was performed at [facility] [address] [place], [place], [place] [address].   IMMUNOHISTOCHEMISTRY DISCLAIMER (if applicable): Some of these immunohistochemical stains may have been developed and the performance characteristics determine by [facility]. Som e may not have been cleared or approved by the U.S. Food and Drug Administration. The FDA has determined that such clearance or approval is not necessary. This test is used for clinical purposes. It should not be regarded as investigational or for research. This laboratory is certified under the [place] (CLIA-88) as qualified to perform high complexity clinical laboratory testing.  The controls stained appropriately.       Assessment & Plan:   Problem List Items Addressed This Visit     Chronic constipation    Chronic constipation of unknown etiology.  Patient has recently had a colonoscopy which was normal.  Symptoms consistent with delayed gastric transit given the presence of nausea on routine basis.  Discussed with patient that we can trial medication to see if we can get improved gastric emptying.  Also  discussed with patient the option of bowel cleanout every few weeks to help prevent backup of stool if she continues to have the incomplete emptying sensation.  Joint decision made to trial Reglan 5 mg 3 times a day as needed to help improve transit.  This is not effective for symptom management will consider stronger medication such as Linzess.  She will follow-up if symptoms worsen or fail to improve with treatment.      Relevant Medications   metoCLOPramide (REGLAN) 10 MG tablet   Bloating   Other Visit Diagnoses     Encounter for annual physical exam    -  Primary   Health care maintenance       Relevant Orders   CBC With Diff/Platelet   Comprehensive metabolic panel   Hemoglobin A1c   VITAMIN D 25 Hydroxy (Vit-D Deficiency, Fractures)   Thyroid Panel With TSH   Lipid panel         IMMUNIZATIONS:   - Tdap: Tetanus vaccination status reviewed: last tetanus booster within 10 years. - Influenza: Postponed to flu season - Pneumovax: Not applicable - Prevnar: Not applicable - HPV: Not applicable - Zostavax vaccine: Not applicable  SCREENING: - Pap smear: up to date - STI testing: deferred -Mammogram: Up to date  - Colonoscopy: Up to date  - Bone Density: Not applicable  -Hearing Test: Not applicable  -Spirometry: Not applicable   Follow up plan: Return in about 1 year (around 01/07/2023) for CPE.  NEXT PREVENTATIVE PHYSICAL DUE IN 1 YEAR.  PATIENT COUNSELING PROVIDED:   For all adult patients, I recommend   A well balanced diet low in saturated fats, cholesterol, and moderation in carbohydrates.   This can be as simple as monitoring portion sizes and cutting back on sugary beverages such as soda and juice to start with.    Daily water consumption of at least 64 ounces.  Physical activity at least 180 minutes per week, if just starting out.   This can be as simple as taking the stairs instead of  the elevator and walking 2-3 laps around the office  purposefully every  day.   STD protection, partner selection, and regular testing if high risk.  Limited consumption of alcoholic beverages if alcohol is consumed.  For women, I recommend no more than 7 alcoholic beverages per week, spread out throughout the week.  Avoid "binge" drinking or consuming large quantities of alcohol in one setting.   Please let me know if you feel you may need help with reduction or quitting alcohol consumption.   Avoidance of nicotine, if used.  Please let me know if you feel you may need help with reduction or quitting nicotine use.   Daily mental health attention.  This can be in the form of 5 minute daily meditation, prayer, journaling, yoga, reflection, etc.   Purposeful attention to your emotions and mental state can significantly improve your overall wellbeing and Health.  Please know that I am here to help you with all of your health care goals and am happy to work with you to find a solution that works best for you.  The greatest advice I have received with any changes in life are to take it one step at a time, that even means if all you can focus on is the next 60 seconds, then do that and celebrate your victories.  With any changes in life, you will have set backs, and that is OK. The important thing to remember is, if you have a set back, it is not a failure, it is an opportunity to try again!  Health Maintenance Recommendations Screening Testing Mammogram Every 1 -2 years based on history and risk factors Starting at age 65 Pap Smear Ages 21-39 every 3 years Ages 53-65 every 5 years with HPV testing More frequent testing may be required based on results and history Colon Cancer Screening Every 1-10 years based on test performed, risk factors, and history Starting at age 25 Bone Density Screening Every 2-10 years based on history Starting at age 43 for women Recommendations for men differ based on medication usage, history, and risk factors AAA Screening One  time ultrasound Men 48-48 years old who have every smoked Lung Cancer Screening Low Dose Lung CT every 12 months Age 66-80 years with a 30 pack-year smoking history who still smoke or who have quit within the last 15 years  Screening Labs Routine  Labs: Complete Blood Count (CBC), Complete Metabolic Panel (CMP), Cholesterol (Lipid Panel) Every 6-12 months based on history and medications May be recommended more frequently based on current conditions or previous results Hemoglobin A1c Lab Every 3-12 months based on history and previous results Starting at age 64 or earlier with diagnosis of diabetes, high cholesterol, BMI >26, and/or risk factors Frequent monitoring for patients with diabetes to ensure blood sugar control Thyroid Panel (TSH w/ T3 & T4) Every 6 months based on history, symptoms, and risk factors May be repeated more often if on medication HIV One time testing for all patients 22 and older May be repeated more frequently for patients with increased risk factors or exposure Hepatitis C One time testing for all patients 75 and older May be repeated more frequently for patients with increased risk factors or exposure Gonorrhea, Chlamydia Every 12 months for all sexually active persons 13-24 years Additional monitoring may be recommended for those who are considered high risk or who have symptoms PSA Men 85-22 years old with risk factors Additional screening may be recommended from age 34-69 based on risk factors,  symptoms, and history  Vaccine Recommendations Tetanus Booster All adults every 10 years Flu Vaccine All patients 6 months and older every year COVID Vaccine All patients 12 years and older Initial dosing with booster May recommend additional booster based on age and health history HPV Vaccine 2 doses all patients age 44-26 Dosing may be considered for patients over 26 Shingles Vaccine (Shingrix) 2 doses all adults 43 years and older Pneumonia (Pneumovax  23) All adults 5 years and older May recommend earlier dosing based on health history Pneumonia (Prevnar 13) All adults 46 years and older Dosed 1 year after Pneumovax 23  Additional Screening, Testing, and Vaccinations may be recommended on an individualized basis based on family history, health history, risk factors, and/or exposure.

## 2022-01-06 NOTE — Assessment & Plan Note (Signed)
Chronic constipation of unknown etiology.  Patient has recently had a colonoscopy which was normal.  Symptoms consistent with delayed gastric transit given the presence of nausea on routine basis.  Discussed with patient that we can trial medication to see if we can get improved gastric emptying.  Also discussed with patient the option of bowel cleanout every few weeks to help prevent backup of stool if she continues to have the incomplete emptying sensation.  Joint decision made to trial Reglan 5 mg 3 times a day as needed to help improve transit.  This is not effective for symptom management will consider stronger medication such as Linzess.  She will follow-up if symptoms worsen or fail to improve with treatment.

## 2022-01-07 LAB — LIPID PANEL
Chol/HDL Ratio: 2.2 ratio (ref 0.0–4.4)
Cholesterol, Total: 177 mg/dL (ref 100–199)
HDL: 80 mg/dL (ref >39)
LDL Chol Calc (NIH): 81 mg/dL (ref 0–99)
Triglycerides: 88 mg/dL (ref 0–149)
VLDL Cholesterol Cal: 16 mg/dL (ref 5–40)

## 2022-01-07 LAB — COMPREHENSIVE METABOLIC PANEL
ALT: 14 IU/L (ref 0–32)
AST: 11 IU/L (ref 0–40)
Albumin/Globulin Ratio: 2 (ref 1.2–2.2)
Albumin: 4.5 g/dL (ref 3.8–4.8)
Alkaline Phosphatase: 36 IU/L — ABNORMAL LOW (ref 44–121)
BUN/Creatinine Ratio: 16 (ref 9–23)
BUN: 11 mg/dL (ref 6–24)
Bilirubin Total: 0.6 mg/dL (ref 0.0–1.2)
CO2: 23 mmol/L (ref 20–29)
Calcium: 9.6 mg/dL (ref 8.7–10.2)
Chloride: 104 mmol/L (ref 96–106)
Creatinine, Ser: 0.69 mg/dL (ref 0.57–1.00)
Globulin, Total: 2.2 g/dL (ref 1.5–4.5)
Glucose: 87 mg/dL (ref 70–99)
Potassium: 4.6 mmol/L (ref 3.5–5.2)
Sodium: 140 mmol/L (ref 134–144)
Total Protein: 6.7 g/dL (ref 6.0–8.5)
eGFR: 108 mL/min/{1.73_m2} (ref >59)

## 2022-01-07 LAB — CBC WITH DIFF/PLATELET
Basophils Absolute: 0 10*3/uL (ref 0.0–0.2)
Basos: 1 %
EOS (ABSOLUTE): 0.3 10*3/uL (ref 0.0–0.4)
Eos: 4 %
Hematocrit: 42.8 % (ref 34.0–46.6)
Hemoglobin: 14.4 g/dL (ref 11.1–15.9)
Immature Grans (Abs): 0 10*3/uL (ref 0.0–0.1)
Immature Granulocytes: 0 %
Lymphocytes Absolute: 1.9 10*3/uL (ref 0.7–3.1)
Lymphs: 27 %
MCH: 32.2 pg (ref 26.6–33.0)
MCHC: 33.6 g/dL (ref 31.5–35.7)
MCV: 96 fL (ref 79–97)
Monocytes Absolute: 0.5 10*3/uL (ref 0.1–0.9)
Monocytes: 7 %
Neutrophils Absolute: 4.3 10*3/uL (ref 1.4–7.0)
Neutrophils: 61 %
Platelets: 232 10*3/uL (ref 150–450)
RBC: 4.47 x10E6/uL (ref 3.77–5.28)
RDW: 11.8 % (ref 11.7–15.4)
WBC: 7 10*3/uL (ref 3.4–10.8)

## 2022-01-07 LAB — VITAMIN D 25 HYDROXY (VIT D DEFICIENCY, FRACTURES): Vit D, 25-Hydroxy: 41.9 ng/mL (ref 30.0–100.0)

## 2022-01-07 LAB — THYROID PANEL WITH TSH
Free Thyroxine Index: 1.6 (ref 1.2–4.9)
T3 Uptake Ratio: 23 % — ABNORMAL LOW (ref 24–39)
T4, Total: 6.9 ug/dL (ref 4.5–12.0)
TSH: 1.89 u[IU]/mL (ref 0.450–4.500)

## 2022-01-07 LAB — HEMOGLOBIN A1C
Est. average glucose Bld gHb Est-mCnc: 100 mg/dL
Hgb A1c MFr Bld: 5.1 % (ref 4.8–5.6)

## 2022-03-25 ENCOUNTER — Encounter (HOSPITAL_BASED_OUTPATIENT_CLINIC_OR_DEPARTMENT_OTHER): Payer: Self-pay | Admitting: Nurse Practitioner

## 2022-03-25 ENCOUNTER — Ambulatory Visit (HOSPITAL_BASED_OUTPATIENT_CLINIC_OR_DEPARTMENT_OTHER): Payer: 59 | Admitting: Nurse Practitioner

## 2022-03-25 VITALS — BP 120/75 | HR 60 | Ht 64.0 in | Wt 172.3 lb

## 2022-03-25 DIAGNOSIS — J189 Pneumonia, unspecified organism: Secondary | ICD-10-CM | POA: Insufficient documentation

## 2022-03-25 HISTORY — DX: Pneumonia, unspecified organism: J18.9

## 2022-03-25 MED ORDER — AZITHROMYCIN 250 MG PO TABS: ORAL_TABLET | ORAL | 0 refills | Status: DC

## 2022-03-25 MED ORDER — ALBUTEROL SULFATE HFA 108 (90 BASE) MCG/ACT IN AERS: 1.0000 | INHALATION_SPRAY | RESPIRATORY_TRACT | 11 refills | Status: DC | PRN

## 2022-03-25 MED ORDER — GUAIFENESIN ER 600 MG PO TB12: 1200.0000 mg | ORAL_TABLET | Freq: Two times a day (BID) | ORAL | 1 refills | Status: DC | PRN

## 2022-03-25 NOTE — Progress Notes (Signed)
Orma Render, DNP, AGNP-c Primary Care & Sports Medicine [address]  [place], [place] [address] [phone]  Subjective:   Kristen Morgan is a 46 y.o. female presents to day for evaluation of: Cough (Pt here for having a cough and congestion in her chest, she stated she was sick about two weeks ago, but theses two symptoms lingered around, no fevers at all )  There are no diagnoses linked to this encounter.  Cough 2 weeks ago sick with URI sx She felt fatigued/exhausted during this time Low grade fevers- 35F The fatigue has resolved but the cough has worsened She tells me that when she takes a deep breath it hurts and this triggers the cough  She tells me that sometimes she is bringing up a small amount of white/clear/light yellow mucous from the lungs She tells me she feels like there is constant drainage and she is still congested in the sinuses and chest.  She has noticed some wheezing and crackles She has been using over the counter medications Dayquil Delsym  PMH, Medications, and Allergies reviewed and updated in chart as appropriate.   ROS negative except for what is listed in HPI. Objective:  BP 120/75   Pulse 60   Ht '5\' 4"'$  (1.626 m)   Wt 172 lb 4.8 oz (78.2 kg)   SpO2 98%   BMI 29.58 kg/m  Physical Exam Vitals and nursing note reviewed.  Constitutional:      Appearance: She is ill-appearing.  HENT:     Head: Normocephalic.     Nose: Congestion and rhinorrhea present.     Mouth/Throat:     Pharynx: Posterior oropharyngeal erythema present.  Eyes:     Extraocular Movements: Extraocular movements intact.     Pupils: Pupils are equal, round, and reactive to light.  Cardiovascular:     Rate and Rhythm: Normal rate and regular rhythm.     Pulses: Normal pulses.     Heart sounds: Normal heart sounds.  Pulmonary:     Effort: Pulmonary effort is normal.     Breath sounds: Wheezing and rhonchi present.  Lymphadenopathy:      Cervical: Cervical adenopathy present.  Skin:    General: Skin is warm and dry.     Capillary Refill: Capillary refill takes less than 2 seconds.  Neurological:     General: No focal deficit present.     Mental Status: She is alert and oriented to person, place, and time.  Psychiatric:        Mood and Affect: Mood normal.        Behavior: Behavior normal.        Thought Content: Thought content normal.        Judgment: Judgment normal.           Assessment & Plan:   Problem List Items Addressed This Visit     Pneumonia of both lungs due to infectious organism - Primary    Bilateral rhonchi and wheezing noted throughout all lung fields.  Airflow appears to be maintained.  Given the recent upper respiratory infection symptoms with some improvement then now prolonged course of cough and associated symptoms strongly suspect bacterial etiology for pneumonia.  Discussion with patient about options for chest x-ray today versus starting antibiotics at this time and monitoring for improvement of symptoms.  Joint decision made to begin antibiotic therapy today.  We will send treatment to pharmacy.  Also recommend an for wheezing and cough.  Patient encouraged to contact the office if her symptoms do not improve within the next week to 10 days or if she notices any signs of worsening symptoms after starting the antibiotics.  If this occurs we will plan to obtain a chest x-ray for further evaluation.      Relevant Medications   azithromycin (ZITHROMAX) 250 MG tablet   albuterol (VENTOLIN HFA) 108 (90 Base) MCG/ACT inhaler   guaiFENesin (MUCINEX) 600 MG 12 hr tablet      Orma Render, DNP, AGNP-c 03/25/2022  6:54 PM    History, Medications, Surgery, SDOH, and Family History reviewed and updated as appropriate.

## 2022-03-25 NOTE — Patient Instructions (Addendum)
  Congestion: Guaifenesin (Mucinex)- follow directions on packaging with a maximum dose of '2400mg'$  in a 24 hour period.  Pain/Fever: Ibuprofen '200mg'$  - '400mg'$  every 4-6 hours as needed (MAX '1200mg'$  in a 24 hour period) Pain/Fever: Tylenol '500mg'$  -'1000mg'$  every 6-8 hours as needed (MAX '3000mg'$  in a 24 hour period)  Cough: Dextromethorphan (Delsym)- follow directions on packing with a maximum dose of '120mg'$  in a 24 hour period.  Nasal Stuffiness: Saline nasal spray and/or Nettie Pot with sterile saline solution  Runny Nose: Fluticasone nasal spray (Flonase) OR Mometasone nasal spray (Nasonex) OR Triamcinolone Acetonide nasal spray (Nasacort)- follow directions on the packaging  Pain/Pressure: Warm washcloth to the face  Sore Throat: Warm salt water gargles  If you have allergies, you may also consider taking an oral antihistamine (like Zyrtec, Claritin, or Xyzal) as these may also help with your symptoms.  **Many medications will have more than one ingredient, be sure you are reading the packaging carefully and not taking more than one dose of the same kind of medication at the same time or too close together. It is OK to use formulas that have all of the ingredients you want, but do not take them in a combined medication and as separate dose too close together. If you have any questions, the pharmacist will be happy to help you decide what is safe.    If you do not feel better in the next week, please let me know and we can plan to get a chest x-ray.   I have sent in the antibiotic and albuterol for you. I also sent in the mucinex.

## 2022-03-25 NOTE — Assessment & Plan Note (Signed)
Bilateral rhonchi and wheezing noted throughout all lung fields.  Airflow appears to be maintained.  Given the recent upper respiratory infection symptoms with some improvement then now prolonged course of cough and associated symptoms strongly suspect bacterial etiology for pneumonia.  Discussion with patient about options for chest x-ray today versus starting antibiotics at this time and monitoring for improvement of symptoms.  Joint decision made to begin antibiotic therapy today.  We will send treatment to pharmacy.  Also recommend an for wheezing and cough.  Patient encouraged to contact the office if her symptoms do not improve within the next week to 10 days or if she notices any signs of worsening symptoms after starting the antibiotics.  If this occurs we will plan to obtain a chest x-ray for further evaluation.

## 2022-04-01 ENCOUNTER — Other Ambulatory Visit: Payer: Self-pay | Admitting: Nurse Practitioner

## 2022-04-01 DIAGNOSIS — Z1231 Encounter for screening mammogram for malignant neoplasm of breast: Secondary | ICD-10-CM

## 2022-04-08 ENCOUNTER — Encounter (HOSPITAL_BASED_OUTPATIENT_CLINIC_OR_DEPARTMENT_OTHER): Payer: Self-pay | Admitting: Nurse Practitioner

## 2022-04-08 ENCOUNTER — Ambulatory Visit (HOSPITAL_BASED_OUTPATIENT_CLINIC_OR_DEPARTMENT_OTHER): Payer: 59 | Admitting: Nurse Practitioner

## 2022-04-08 ENCOUNTER — Ambulatory Visit (INDEPENDENT_AMBULATORY_CARE_PROVIDER_SITE_OTHER): Payer: 59

## 2022-04-08 VITALS — BP 109/73 | HR 67 | Ht 64.0 in | Wt 173.0 lb

## 2022-04-08 DIAGNOSIS — R052 Subacute cough: Secondary | ICD-10-CM

## 2022-04-08 DIAGNOSIS — R062 Wheezing: Secondary | ICD-10-CM

## 2022-04-08 HISTORY — DX: Subacute cough: R05.2

## 2022-04-08 MED ORDER — CEFTRIAXONE SODIUM 500 MG IJ SOLR
500.0000 mg | Freq: Once | INTRAMUSCULAR | Status: AC
  Administered 2022-04-08: 500 mg via INTRAMUSCULAR

## 2022-04-08 MED ORDER — DEXAMETHASONE 4 MG PO TABS: 4.0000 mg | ORAL_TABLET | Freq: Every day | ORAL | 0 refills | Status: DC

## 2022-04-08 MED ORDER — AMOXICILLIN-POT CLAVULANATE 875-125 MG PO TABS: 1.0000 | ORAL_TABLET | Freq: Two times a day (BID) | ORAL | 0 refills | Status: DC

## 2022-04-08 NOTE — Progress Notes (Signed)
Orma Render, DNP, AGNP-c [facility] [address]  [place], [place] [address] [phone]  Subjective:   Kristen Morgan is a 46 y.o. female presents to day for evaluation of: Acute Visit (Patient presents today coughing, wheezing and no better. She has taken all her antibiotics./)  1. Subacute cough Patient endorses concerns of dyspnea, facial pain (maxillary), nasal congestion, productive cough with sputum described as yellow, green, and mucoid, rhinorrhea , congestion,, and wheezing.  Symptoms began 4 weeks ago.  The cough is productive of green/yellow sputum, with wheezing, with shortness of breath, chest is painful during coughing, waxing and waning over time and is aggravated by nothing Associated symptoms include:change in voice, postnasal drip, shortness of breath, and wheezing. Patient does not have new pets. Patient does not have a history of asthma. Patient does have a history of environmental allergens. Patient has not recent travel. Patient does not have a history of smoking. Patient  has not previous Chest X-ray. Patient has not had a PPD done. She has completed a course of azithromycin with no improvement of symptoms. She has been using mucinex and albuterol for symptom management. She has been taking her regular allergy medication with no relief. She denies fever. She endorses feeling generally unwell.   PMH, Medications, and Allergies reviewed and updated in chart as appropriate.   ROS negative except for what is listed in HPI. Objective:  BP 109/73   Pulse 67   Ht '5\' 4"'$  (1.626 m)   Wt 173 lb (78.5 kg)   SpO2 98%   BMI 29.70 kg/m  Physical Exam Vitals and nursing note reviewed.  Constitutional:      Appearance: She is ill-appearing.  HENT:     Head: Normocephalic.     Nose: Congestion and rhinorrhea present.  Eyes:     Extraocular Movements: Extraocular movements intact.     Pupils: Pupils are equal,  round, and reactive to light.  Cardiovascular:     Rate and Rhythm: Normal rate and regular rhythm.     Pulses: Normal pulses.     Heart sounds: Normal heart sounds.  Pulmonary:     Effort: Pulmonary effort is normal.     Breath sounds: Wheezing and rhonchi present.  Lymphadenopathy:     Cervical: Cervical adenopathy present.  Skin:    General: Skin is warm and dry.     Capillary Refill: Capillary refill takes less than 2 seconds.  Neurological:     General: No focal deficit present.     Mental Status: She is alert and oriented to person, place, and time.  Psychiatric:        Mood and Affect: Mood normal.        Behavior: Behavior normal.        Thought Content: Thought content normal.        Judgment: Judgment normal.           Assessment & Plan:   Problem List Items Addressed This Visit     Subacute cough - Primary    Ongoing cough now at week 4 despite supportive care and azithromycin. Unfortunately, her symptoms have worsened over time and now she is experiencing sinus congestion, as well. Audible wheezing on inhalation and exhalation present with rhonchi in the bases bilaterally. She appears unwell. We will plan to get an x-ray today and she has been given an injection of rocephin. We will start azithromycin and dexamethasone tomorrow. Will make changes to  plan of care based on imaging as necessary.       Relevant Medications   amoxicillin-clavulanate (AUGMENTIN) 875-125 MG tablet   dexamethasone (DECADRON) 4 MG tablet   Other Relevant Orders   DG Chest 2 View   Other Visit Diagnoses     Wheeze       Relevant Medications   amoxicillin-clavulanate (AUGMENTIN) 875-125 MG tablet   dexamethasone (DECADRON) 4 MG tablet   cefTRIAXone (ROCEPHIN) injection 500 mg (Completed) (Start on 04/08/2022  5:00 PM)   Other Relevant Orders   DG Chest 2 View         Orma Render, DNP, AGNP-c 04/08/2022  4:59 PM    History, Medications, Surgery, SDOH, and Family History  reviewed and updated as appropriate.

## 2022-04-08 NOTE — Patient Instructions (Addendum)
I want to get an x-ray of your lungs today to make sure that they look ok.   We have given you a shot of rocephin today- I have sent in the antibiotic and steroid for you. You can start these in the morning.

## 2022-04-08 NOTE — Assessment & Plan Note (Signed)
Ongoing cough now at week 4 despite supportive care and azithromycin. Unfortunately, her symptoms have worsened over time and now she is experiencing sinus congestion, as well. Audible wheezing on inhalation and exhalation present with rhonchi in the bases bilaterally. She appears unwell. We will plan to get an x-ray today and she has been given an injection of rocephin. We will start azithromycin and dexamethasone tomorrow. Will make changes to plan of care based on imaging as necessary.

## 2022-04-11 ENCOUNTER — Encounter (HOSPITAL_BASED_OUTPATIENT_CLINIC_OR_DEPARTMENT_OTHER): Payer: Self-pay | Admitting: Nurse Practitioner

## 2022-04-11 DIAGNOSIS — B379 Candidiasis, unspecified: Secondary | ICD-10-CM

## 2022-04-11 DIAGNOSIS — A499 Bacterial infection, unspecified: Secondary | ICD-10-CM

## 2022-04-11 DIAGNOSIS — J40 Bronchitis, not specified as acute or chronic: Secondary | ICD-10-CM

## 2022-04-16 MED ORDER — LEVOFLOXACIN 500 MG PO TABS: 500.0000 mg | ORAL_TABLET | Freq: Every day | ORAL | 0 refills | Status: AC

## 2022-04-22 MED ORDER — FLUCONAZOLE 150 MG PO TABS: ORAL_TABLET | ORAL | 2 refills | Status: DC

## 2022-04-22 MED ORDER — PREDNISONE 20 MG PO TABS: 20.0000 mg | ORAL_TABLET | Freq: Every day | ORAL | 0 refills | Status: DC

## 2022-04-22 NOTE — Addendum Note (Signed)
Addended by: Matayah Reyburn, Clarise Cruz E on: 04/22/2022 08:59 PM   Modules accepted: Orders

## 2022-05-06 ENCOUNTER — Ambulatory Visit
Admission: RE | Admit: 2022-05-06 | Discharge: 2022-05-06 | Disposition: A | Discharge status: Home / Self Care | Payer: 59 | Source: Ambulatory Visit | Attending: Nurse Practitioner | Admitting: Nurse Practitioner

## 2022-05-06 DIAGNOSIS — Z1231 Encounter for screening mammogram for malignant neoplasm of breast: Secondary | ICD-10-CM

## 2022-05-21 ENCOUNTER — Other Ambulatory Visit (HOSPITAL_BASED_OUTPATIENT_CLINIC_OR_DEPARTMENT_OTHER): Payer: Self-pay

## 2022-05-21 ENCOUNTER — Encounter (HOSPITAL_BASED_OUTPATIENT_CLINIC_OR_DEPARTMENT_OTHER): Payer: Self-pay | Admitting: Nurse Practitioner

## 2022-05-21 ENCOUNTER — Ambulatory Visit (HOSPITAL_BASED_OUTPATIENT_CLINIC_OR_DEPARTMENT_OTHER): Payer: 59 | Admitting: Nurse Practitioner

## 2022-05-21 VITALS — BP 124/77 | HR 60 | Temp 98.3°F | Ht 63.75 in | Wt 173.0 lb

## 2022-05-21 DIAGNOSIS — B360 Pityriasis versicolor: Secondary | ICD-10-CM

## 2022-05-21 DIAGNOSIS — J45909 Unspecified asthma, uncomplicated: Secondary | ICD-10-CM | POA: Insufficient documentation

## 2022-05-21 DIAGNOSIS — J209 Acute bronchitis, unspecified: Secondary | ICD-10-CM | POA: Diagnosis not present

## 2022-05-21 HISTORY — DX: Pityriasis versicolor: B36.0

## 2022-05-21 MED ORDER — FLUTICASONE-SALMETEROL 250-50 MCG/ACT IN AEPB: 1.0000 | INHALATION_SPRAY | Freq: Two times a day (BID) | RESPIRATORY_TRACT | 3 refills | Status: DC

## 2022-05-21 MED ORDER — PREDNISONE 20 MG PO TABS: ORAL_TABLET | ORAL | 0 refills | Status: DC

## 2022-05-21 MED ORDER — KETOCONAZOLE 2 % EX SHAM: 1.0000 | MEDICATED_SHAMPOO | CUTANEOUS | 0 refills | Status: DC

## 2022-05-21 NOTE — Patient Instructions (Signed)
I have sent in the Advair for you to use twice a day for at least the next 30 days, even when the symptoms improve.   I have also sent in the prednisone taper for you to use. You can start this in a day or so, if you want to see what the inhaler does first or you can go ahead and start this today to hit it hard.   I have sent in a referral to pulmonology just in case this is not getting better over the next few days. If it does get better then we can cancel the referral, but if not we already have this in the works.

## 2022-05-21 NOTE — Progress Notes (Signed)
Orma Render, DNP, AGNP-c [facility] [address]  [place], [place] [address] [phone]  Subjective:   Kristen Morgan is a 46 y.o. 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   ROS negative except for what is listed in HPI. Objective:  BP 124/77 (BP Location: Left Arm, Patient Position: Sitting)   Pulse 60   Temp 98.3 F (36.8 C)   Ht 5' 3.75" (1.619 m)   Wt 173 lb (78.5 kg)   SpO2 98%   BMI 29.93 kg/m  Physical Exam Vitals and nursing note reviewed.  Constitutional:      Appearance: Normal appearance.  HENT:     Head: Normocephalic.     Right Ear: Tympanic membrane normal.     Left Ear: Tympanic membrane normal.     Nose: Nose normal.     Mouth/Throat:     Mouth: Mucous membranes are moist.     Pharynx: Oropharynx is clear.  Eyes:     Extraocular Movements: Extraocular movements intact.     Pupils: Pupils are equal, round, and reactive to light.  Cardiovascular:     Rate and Rhythm: Normal rate and regular rhythm.     Pulses: Normal pulses.     Heart sounds: Normal heart sounds.  Pulmonary:     Effort: Pulmonary effort is normal.     Breath sounds: Wheezing present.  Musculoskeletal:     Cervical back: Normal range of motion.  Lymphadenopathy:     Cervical: No cervical adenopathy.  Skin:    General: Skin is warm and dry.     Comments: Patchy areas of hypopigmentation with coalescence to the upper body. No erythema, wounds, drainage.    Neurological:     General: No focal deficit present.     Mental Status: She is alert and oriented to person, place, and time.  Psychiatric:        Mood and Affect: Mood normal.        Behavior: Behavior normal.        Thought Content: Thought content  normal.        Judgment: Judgment normal.           Assessment & Plan:   Problem List Items Addressed This Visit     Bronchitis with bronchospasm - Primary    Ongoing cough and wheezing following suspected pneumonia infection in September of this year. CXR has been completed. Bilateral wheezing is present on auscultation with both inspiration and expiration. O2 sats are appropriate and no signs of distress at this time. Given the length of time symptoms have been present, I do feel that referral to pulmonology is appropriate. We will attempt to treat today with a steroid burst to see if we can calm the inflammatory process. Will also start on Advair to see if this is helpful for symptom management. Alarm symptoms discussed with patient and recommended follow-up.       Relevant Medications   fluticasone-salmeterol (ADVAIR DISKUS) 250-50 MCG/ACT AEPB   predniSONE (DELTASONE) 20 MG tablet   Other Relevant Orders   Ambulatory referral to Pulmonology   Tinea versicolor    Patchy areas of hypopigmentation noted on the chest and back consistent with tinea versicolor. Recommend trial of keotconazole shampoo as  body wash for several weeks to see if this helps to alleviate the issue. If symptoms do not improve, we will plan to obtain a skin scraping for laboratory evaluation.       Relevant Medications   ketoconazole (NIZORAL) 2 % shampoo      Orma Render, DNP, AGNP-c 06/06/2022  7:33 AM    History, Medications, Surgery, SDOH, and Family History reviewed and updated as appropriate.

## 2022-06-06 ENCOUNTER — Encounter (HOSPITAL_BASED_OUTPATIENT_CLINIC_OR_DEPARTMENT_OTHER): Payer: Self-pay | Admitting: Nurse Practitioner

## 2022-06-06 NOTE — Assessment & Plan Note (Signed)
Patchy areas of hypopigmentation noted on the chest and back consistent with tinea versicolor. Recommend trial of keotconazole shampoo as body wash for several weeks to see if this helps to alleviate the issue. If symptoms do not improve, we will plan to obtain a skin scraping for laboratory evaluation.

## 2022-06-06 NOTE — Assessment & Plan Note (Signed)
Ongoing cough and wheezing following suspected pneumonia infection in September of this year. CXR has been completed. Bilateral wheezing is present on auscultation with both inspiration and expiration. O2 sats are appropriate and no signs of distress at this time. Given the length of time symptoms have been present, I do feel that referral to pulmonology is appropriate. We will attempt to treat today with a steroid burst to see if we can calm the inflammatory process. Will also start on Advair to see if this is helpful for symptom management. Alarm symptoms discussed with patient and recommended follow-up.

## 2022-06-10 ENCOUNTER — Encounter (HOSPITAL_BASED_OUTPATIENT_CLINIC_OR_DEPARTMENT_OTHER): Payer: Self-pay | Admitting: Pulmonary Disease

## 2022-06-10 ENCOUNTER — Ambulatory Visit (INDEPENDENT_AMBULATORY_CARE_PROVIDER_SITE_OTHER): Payer: 59 | Admitting: Pulmonary Disease

## 2022-06-10 VITALS — BP 112/68 | HR 71 | Temp 98.8°F | Ht 64.0 in | Wt 172.8 lb

## 2022-06-10 DIAGNOSIS — J4540 Moderate persistent asthma, uncomplicated: Secondary | ICD-10-CM

## 2022-06-10 LAB — PULMONARY FUNCTION TEST
FEF 25-75 Post: 4.27 L/sec
FEF 25-75 Pre: 3.69 L/sec
FEF2575-%Change-Post: 15 %
FEF2575-%Pred-Post: 148 %
FEF2575-%Pred-Pre: 128 %
FEV1-%Change-Post: 3 %
FEV1-%Pred-Post: 110 %
FEV1-%Pred-Pre: 106 %
FEV1-Post: 3.16 L
FEV1-Pre: 3.06 L
FEV1FVC-%Change-Post: 0 %
FEV1FVC-%Pred-Pre: 108 %
FEV6-%Change-Post: 3 %
FEV6-%Pred-Post: 103 %
FEV6-%Pred-Pre: 99 %
FEV6-Post: 3.63 L
FEV6-Pre: 3.49 L
FEV6FVC-%Pred-Post: 102 %
FEV6FVC-%Pred-Pre: 102 %
FVC-%Change-Post: 3 %
FVC-%Pred-Post: 100 %
FVC-%Pred-Pre: 97 %
FVC-Post: 3.63 L
FVC-Pre: 3.49 L
Post FEV1/FVC ratio: 87 %
Post FEV6/FVC ratio: 100 %
Pre FEV1/FVC ratio: 88 %
Pre FEV6/FVC Ratio: 100 %

## 2022-06-10 NOTE — Progress Notes (Signed)
Subjective:    Patient ID: Kristen Morgan, female    DOB: Apr 03, 1976, 46 y.o.   MRN: [id number]  HPI  Chief Complaint  Patient presents with   Consult    Pt states she was referred because of some wheezing and some SOB with exertion and tightness inher chest x 3 months.   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 She reports onset of symptoms around mid September, acute onset when she started wheezing with cough and shortness of breath especially when she would go for her morning walk in the cold air.  She had significant throat clearing Symptoms would be worse in the mornings or evenings and with exercise and will be improved by using albuterol inhaler.  She tested herself for COVID-negative X 3.  She had 2 courses of antibiotics and steroids with improvement but then symptoms or relapse.  She was finally placed on Advair 250 with significant improvement. Now her cough is resolved, she reports hoarseness of voice and occasional chest tightness.  Chest x-ray 10/3 reviewed shows clear lungs  She now denies postnasal drip or reflux symptoms.  There is no family history no childhood history of asthma no seasonal allergies.  She has lived in [place] for 8 to 9 years and [place] in [place] prior to that, originally from [place]. Environment -2 kids, a cat for 16 years    Past Medical History:  Diagnosis Date   Arthritis    back/ right hip   IBS (irritable bowel syndrome)    Pneumonia of both lungs due to infectious organism 03/25/2022   Subacute cough 04/08/2022   Thyroid disease    Tubular adenoma of colon 08/2017   Past Surgical History:  Procedure Laterality Date   CERVICAL CONIZATION W/BX N/A 06/15/2019   Procedure: CONIZATION CERVIX WITH BIOPSY;  Surgeon: [name], MD;  Location: [facility];  Service: Gynecology;  Laterality: N/A;   CESAREAN SECTION     x 2   COLONOSCOPY      DILATATION & CURETTAGE/HYSTEROSCOPY WITH MYOSURE N/A 06/15/2019   Procedure: DILATATION & CURETTAGE/HYSTEROSCOPY WITH MYOSURE;  Surgeon: [name], MD;  Location: [facility];  Service: Gynecology;  Laterality: N/A;   INTRAUTERINE DEVICE (IUD) INSERTION N/A 06/15/2019   Procedure: INTRAUTERINE DEVICE (IUD) INSERTION;  Surgeon: [name], MD;  Location: [facility];  Service: Gynecology;  Laterality: N/A;   LEEP      Allergies  Allergen Reactions   Sulfa Antibiotics Hives and Rash    Social History   Socioeconomic History   Marital status: Married    Spouse name: Not on file   Number of children: 2   Years of education: Not on file   Highest education level: Not on file  Occupational History   Not on file  Tobacco Use   Smoking status: Former    Types: Cigarettes    Quit date: 07/07/2008    Years since quitting: 13.9    Passive exposure: Never   Smokeless tobacco: Never  Vaping Use   Vaping Use: Never used  Substance and Sexual Activity   Alcohol use: Yes    Comment: 3 drinks/week, combination of wine, beer, and liquor   Drug use: No   Sexual activity: Yes    Birth control/protection: I.U.D.  Other Topics Concern   Not on file  Social History Narrative   Not on file   Social Determinants of Health   Financial  Resource Strain: Not on file  Food Insecurity: Not on file  Transportation Needs: Not on file  Physical Activity: Not on file  Stress: Not on file  Social Connections: Not on file  Intimate Partner Violence: Not on file    Family History  Problem Relation Age of Onset   Thyroid disease Mother    Arthritis Father    Thyroid disease Maternal Aunt    Diabetes Paternal Uncle    Thyroid disease Maternal Grandmother    Colon cancer Maternal Grandfather    Diabetes Paternal Grandmother    Breast cancer Neg Hx    Esophageal cancer Neg Hx    Rectal cancer Neg Hx    Stomach cancer Neg Hx       Review of  Systems  Constitutional: negative for anorexia, fevers and sweats  Eyes: negative for irritation, redness and visual disturbance  Ears, nose, mouth, throat, and face: negative for earaches, epistaxis, nasal congestion and sore throat  Respiratory: negative for cough, dyspnea on exertion, sputum and wheezing  Cardiovascular: negative for chest pain, dyspnea, lower extremity edema, orthopnea, palpitations and syncope  Gastrointestinal: negative for abdominal pain, constipation, diarrhea, melena, nausea and vomiting  Genitourinary:negative for dysuria, frequency and hematuria  Hematologic/lymphatic: negative for bleeding, easy bruising and lymphadenopathy  Musculoskeletal:negative for arthralgias, muscle weakness and stiff joints  Neurological: negative for coordination problems, gait problems, headaches and weakness  Endocrine: negative for diabetic symptoms including polydipsia, polyuria and weight loss      Objective:   Physical Exam  Gen. Pleasant, well-nourished, in no distress, normal affect ENT - no pallor,icterus, no post nasal drip, no thrush Neck: No JVD, no thyromegaly, no carotid bruits Lungs: no use of accessory muscles, no dullness to percussion, clear without rales or rhonchi  Cardiovascular: Rhythm regular, heart sounds  normal, no murmurs or gallops, no peripheral edema Abdomen: soft and non-tender, no hepatosplenomegaly, BS normal. Musculoskeletal: No deformities, no cyanosis or clubbing Neuro:  alert, non focal       Assessment & Plan:

## 2022-06-10 NOTE — Assessment & Plan Note (Addendum)
she seems to have reactive airways, possibly related to seasonal allergies No other trigger is apparent on detailed history in her environment, no overt reflux  X spirometry pre-post  If lung function looks good, we can decrease Advair in January and taper to off. Okay to use albuterol 2 puffs as needed prior to exercise or for wheezing Claritin or Zyrtec during spring and fall  Current symptoms of hoarseness may be related to inhaled steroids  If persistent symptoms, we can investigate further with FeNO, RAST panel

## 2022-06-10 NOTE — Progress Notes (Signed)
Pre/Post Spirometry Performed Today. 

## 2022-06-10 NOTE — Patient Instructions (Addendum)
Pre/Post Spirometry Performed Today. 

## 2022-06-10 NOTE — Patient Instructions (Addendum)
You have reactive airways, possibly related to seasonal allergies  X spirometry pre-post  If lung function looks good, we can decrease Advair in January and taper to off. Okay to use albuterol 2 puffs as needed prior to exercise or for wheezing  If persistent symptoms, we can investigate further

## 2022-06-12 ENCOUNTER — Telehealth (HOSPITAL_BASED_OUTPATIENT_CLINIC_OR_DEPARTMENT_OTHER): Payer: Self-pay

## 2022-06-12 NOTE — Telephone Encounter (Signed)
-----   Message from Rigoberto Noel, MD sent at 06/10/2022  1:05 PM EST ----- Lung function was great 100% She should continue Advair for December and can stop using it in January and use albuterol only as needed, call us back if symptoms persist

## 2022-06-12 NOTE — Telephone Encounter (Signed)
Called Pt with results of PFT. Pt stated understanding and nothing further needed.

## 2022-06-16 ENCOUNTER — Ambulatory Visit: Payer: 59 | Admitting: Adult Health

## 2022-08-11 ENCOUNTER — Ambulatory Visit: Payer: 59 | Admitting: Adult Health

## 2022-08-12 ENCOUNTER — Encounter: Payer: Self-pay | Admitting: Adult Health

## 2022-08-12 ENCOUNTER — Ambulatory Visit: Payer: 59 | Admitting: Adult Health

## 2022-08-12 VITALS — BP 106/80 | HR 74 | Temp 98.1°F | Ht 63.0 in | Wt 174.0 lb

## 2022-08-12 DIAGNOSIS — J4520 Mild intermittent asthma, uncomplicated: Secondary | ICD-10-CM

## 2022-08-12 DIAGNOSIS — I4891 Unspecified atrial fibrillation: Secondary | ICD-10-CM | POA: Diagnosis not present

## 2022-08-12 DIAGNOSIS — J45909 Unspecified asthma, uncomplicated: Secondary | ICD-10-CM | POA: Insufficient documentation

## 2022-08-12 NOTE — Progress Notes (Signed)
$'@Patient'G$  ID: Kristen Morgan, female    DOB: 1976/03/14, 47 y.o.   MRN: [id number]  Chief Complaint  Patient presents with   Follow-up    Referring provider: Orma Render, NP  HPI: 47 year old female never smoker seen for pulmonary consult June 10, 2022 for shortness of breath, cough and wheezing  TEST/EVENTS :   08/12/2022 Follow up ;  Patient presents for a 48-monthfollow-up.  Patient was seen last visit for pulmonary consult.  She was having ongoing cough wheezing and shortness of breath after a URI in early September. She was felt to have reactive airways disease.  She was treated with Advair and Zyrtec.  PFTs showed normal lung function with FEV1 at 110%, ratio 87, FVC 100%, no significant bronchodilator response.  Chest x-ray in October 2023 was clear.  Patient was recommended to wean off of Advair.  Since last visit patient is feeling she is feeling much better.  Cough is totally resolved.  Her shortness of breath and wheezing is also decreased.  She has weaned completely off of Advair.  She says she does use her albuterol about once or twice weekly.  She continues to be active and exercise.  She says occasionally with exercise she will feel a little tightness.  She says she is unable to run and cold weather she definitely has tightness and wheezing if she tries to exercise when the cold air hits throat.  She says that she has occasional nasal drainage.  She denies any fever, chest pain, orthopnea, edema.  Allergies  Allergen Reactions   Sulfa Antibiotics Hives and Rash    Immunization History  Administered Date(s) Administered   Influenza Inj Mdck Quad Pf 04/08/2019   Influenza-Unspecified 05/08/2017    Past Medical History:  Diagnosis Date   Arthritis    back/ right hip   IBS (irritable bowel syndrome)    Pneumonia of both lungs due to infectious organism 03/25/2022   Subacute cough 04/08/2022   Thyroid disease    Tubular adenoma of colon 08/2017    Tobacco  History: Social History   Tobacco Use  Smoking Status Former   Types: Cigarettes   Quit date: 07/07/2008   Years since quitting: 14.1   Passive exposure: Never  Smokeless Tobacco Never   Counseling given: Not Answered   Outpatient Medications Prior to Visit  Medication Sig Dispense Refill   Azelastine HCl 137 MCG/SPRAY SOLN 1 puff in each nostril     ketoconazole (NIZORAL) 2 % shampoo Apply 1 Application topically 2 (two) times a week. Use as body wash. 120 mL 0   liothyronine (CYTOMEL) 5 MCG tablet Take 5 mcg by mouth 2 (two) times daily.     Multiple Vitamin (MULTIVITAMIN) tablet Take 1 tablet by mouth daily.     Probiotic Product (PROBIOTIC-10 PO) Take 1 capsule by mouth daily.     SYNTHROID 50 MCG tablet Take 50 mcg by mouth daily.     fluticasone-salmeterol (ADVAIR DISKUS) 250-50 MCG/ACT AEPB Inhale 1 puff into the lungs in the morning and at bedtime. (Patient not taking: Reported on 08/12/2022) 60 each 3   No facility-administered medications prior to visit.     Review of Systems:   Constitutional:   No  weight loss, night sweats,  Fevers, chills, fatigue, or  lassitude.  HEENT:   No headaches,  Difficulty swallowing,  Tooth/dental problems, or  Sore throat,                No sneezing,  itching, ear ache,  +nasal congestion, post nasal drip,   CV:  No chest pain,  Orthopnea, PND, swelling in lower extremities, anasarca, dizziness, palpitations, syncope.   GI  No heartburn, indigestion, abdominal pain, nausea, vomiting, diarrhea, change in bowel habits, loss of appetite, bloody stools.   Resp:   No chest wall deformity  Skin: no rash or lesions.  GU: no dysuria, change in color of urine, no urgency or frequency.  No flank pain, no hematuria   MS:  No joint pain or swelling.  No decreased range of motion.  No back pain.    Physical Exam  BP 106/80 (BP Location: Left Arm, Patient Position: Sitting, Cuff Size: Large)   Pulse 74   Temp 98.1 F (36.7 C) (Oral)   Ht  '5\' 3"'$  (1.6 m)   Wt 174 lb (78.9 kg)   SpO2 97%   BMI 30.82 kg/m   GEN: A/Ox3; pleasant , NAD, well nourished    HEENT:  [place]/AT,   NOSE-clear, THROAT-clear, no lesions, no postnasal drip or exudate noted.   NECK:  Supple w/ fair ROM; no JVD; normal carotid impulses w/o bruits; no thyromegaly or nodules palpated; no lymphadenopathy.    RESP  Clear  P & A; w/o, wheezes/ rales/ or rhonchi. no accessory muscle use, no dullness to percussion  CARD:  RRR, no m/r/g, no peripheral edema, pulses intact, no cyanosis or clubbing.  GI:   Soft & nt; nml bowel sounds; no organomegaly or masses detected.   Musco: Warm bil, no deformities or joint swelling noted.   Neuro: alert, no focal deficits noted.    Skin: Warm, no lesions or rashes    Lab Results:  CBC    Component Value Date/Time   WBC 7.0 01/06/2022 1030   WBC 7.8 06/15/2019 1100   RBC 4.47 01/06/2022 1030   RBC 4.39 06/15/2019 1100   HGB 14.4 01/06/2022 1030   HCT 42.8 01/06/2022 1030   PLT 232 01/06/2022 1030   MCV 96 01/06/2022 1030   MCH 32.2 01/06/2022 1030   MCH 31.9 06/15/2019 1100   MCHC 33.6 01/06/2022 1030   MCHC 33.6 06/15/2019 1100   RDW 11.8 01/06/2022 1030   LYMPHSABS 1.9 01/06/2022 1030   MONOABS 0.4 07/27/2017 0827   EOSABS 0.3 01/06/2022 1030   BASOSABS 0.0 01/06/2022 1030    BMET    Component Value Date/Time   NA 140 01/06/2022 1030   K 4.6 01/06/2022 1030   CL 104 01/06/2022 1030   CO2 23 01/06/2022 1030   GLUCOSE 87 01/06/2022 1030   GLUCOSE 100 (H) 07/27/2017 0827   BUN 11 01/06/2022 1030   CREATININE 0.69 01/06/2022 1030   CALCIUM 9.6 01/06/2022 1030    BNP No results found for: "BNP"  ProBNP No results found for: "PROBNP"  Imaging: No results found.       Latest Ref Rng & Units 06/10/2022    9:39 AM  PFT Results  FVC-Pre L 3.49   FVC-Predicted Pre % 97   FVC-Post L 3.63   FVC-Predicted Post % 100   Pre FEV1/FVC % % 88   Post FEV1/FCV % % 87   FEV1-Pre L 3.06    FEV1-Predicted Pre % 106   FEV1-Post L 3.16     No results found for: "NITRICOXIDE"      Assessment & Plan:   Asthma Recent post viral asthma flare.  Symptoms have improved.  She has been able to wean off of maintenance inhaler with Advair.  Only using albuterol once or twice a week.  She may have also a component of exercise-induced asthma.  We discussed asthma action plan.  For now is doing well on as needed albuterol.  Could consider changing to AirSupra if increased asthma symptoms or frequent flares. Continue with trigger prevention.  Plan  Patient Instructions  Albuterol inhaler 1-2 puffs every 4-6hrs as needed  Zyrtec '10mg'$  At bedtime  As needed   Asthma action as discussed  Follow up in 6 months with Dr. Elsworth Soho  and As needed        Rexene Edison, NP 08/12/2022

## 2022-08-12 NOTE — Progress Notes (Signed)
Has had the first 2 Pfizer vaccines and 1 booster.

## 2022-08-12 NOTE — Assessment & Plan Note (Signed)
Recent post viral asthma flare.  Symptoms have improved.  She has been able to wean off of maintenance inhaler with Advair.  Only using albuterol once or twice a week.  She may have also a component of exercise-induced asthma.  We discussed asthma action plan.  For now is doing well on as needed albuterol.  Could consider changing to AirSupra if increased asthma symptoms or frequent flares. Continue with trigger prevention.  Plan  Patient Instructions  Albuterol inhaler 1-2 puffs every 4-6hrs as needed  Zyrtec '10mg'$  At bedtime  As needed   Asthma action as discussed  Follow up in 6 months with Dr. Elsworth Soho  and As needed

## 2022-08-12 NOTE — Assessment & Plan Note (Signed)
>>  ASSESSMENT AND PLAN FOR ASTHMA WRITTEN ON 08/12/2022 10:17 AM BY PARRETT, TAMMY S, NP  Recent post viral asthma flare.  Symptoms have improved.  She has been able to wean off of maintenance inhaler with Advair.  Only using albuterol  once or twice a week.  She may have also a component of exercise-induced asthma.  We discussed asthma action plan.  For now is doing well on as needed albuterol .  Could consider changing to AirSupra if increased asthma symptoms or frequent flares. Continue with trigger prevention.  Plan  Patient Instructions  Albuterol  inhaler 1-2 puffs every 4-6hrs as needed  Zyrtec 10mg  At bedtime  As needed   Asthma action as discussed  Follow up in 6 months with Dr. Jude  and As needed

## 2022-08-12 NOTE — Patient Instructions (Signed)
Albuterol inhaler 1-2 puffs every 4-6hrs as needed  Zyrtec '10mg'$  At bedtime  As needed   Asthma action as discussed  Follow up in 6 months with Dr. Elsworth Soho  and As needed

## 2022-08-21 ENCOUNTER — Encounter: Payer: Self-pay | Admitting: Nurse Practitioner

## 2022-08-22 ENCOUNTER — Encounter: Payer: Self-pay | Admitting: Medical

## 2022-08-22 ENCOUNTER — Telehealth: Payer: 59 | Admitting: Medical

## 2022-08-22 DIAGNOSIS — U071 COVID-19: Secondary | ICD-10-CM

## 2022-08-22 MED ORDER — NIRMATRELVIR/RITONAVIR (PAXLOVID)TABLET: 3.0000 | ORAL_TABLET | Freq: Two times a day (BID) | ORAL | 0 refills | Status: AC

## 2022-08-22 NOTE — Progress Notes (Signed)
Subjective:     Patient ID: Kristen Morgan, female   DOB: 05-27-1976, 47 y.o.   MRN: [id number]  This visit type was conducted due to national recommendations for restrictions regarding the COVID-19 Pandemic (e.g. social distancing) in an effort to limit this patient's exposure and mitigate transmission in our community.  Due to their co-morbid illnesses, this patient is at least at moderate risk for complications without adequate follow up.  This format is felt to be most appropriate for this patient at this time.    Documentation for virtual audio and video telecommunications through [facility] encounter:  The patient was located at home. The provider was located in the office. The patient did consent to this visit and is aware of possible charges through their insurance for this visit.  The other persons participating in this telemedicine service were none. Time spent on call was 20 minutes and in review of previous records 20 minutes total.  This virtual service is not related to other E/M service within previous 7 days.   HPI Chief Complaint  Patient presents with   Covid Positive    Tested positive for COVID yesterday. Has had head and nasal congestion, runny nose, dry throat and cough x 3 days. Denies fever, body aches or chills. Has tried using DayQuil and NyQuil.    Virtual for illness.  Has 3 days of symptoms but tested positive for covid yesterday.   Cough is intermittent.  Has a lot of snot and head congestion.   Hasn't used any nasal saline flush.  No prior covid.  She had a really bad respiratory tract infection in September that took a few months to fully resolve, worse other than her norm.  Ended up seeing pulmonology and having testing and evaluation and ended up using inhalers and other medications to finally get better.  Just saw pulmonolog 2 weeks ago for follow-up on the September illness.   Still has albuterol inhaler on hand if needed, but not having to use it  currently. . No other aggravating or relieving factors. No other complaint.    Past Medical History:  Diagnosis Date   Arthritis    back/ right hip   IBS (irritable bowel syndrome)    Pneumonia of both lungs due to infectious organism 03/25/2022   Subacute cough 04/08/2022   Thyroid disease    Tubular adenoma of colon 08/2017   Current Outpatient Medications on File Prior to Visit  Medication Sig Dispense Refill   levonorgestrel (MIRENA) 20 MCG/DAY IUD 1 each by Intrauterine route once.     liothyronine (CYTOMEL) 5 MCG tablet Take 5 mcg by mouth 2 (two) times daily.     Multiple Vitamin (MULTIVITAMIN) tablet Take 1 tablet by mouth daily.     Probiotic Product (PROBIOTIC-10 PO) Take 1 capsule by mouth daily.     SYNTHROID 50 MCG tablet Take 50 mcg by mouth daily.     [DISCONTINUED] albuterol (VENTOLIN HFA) 108 (90 Base) MCG/ACT inhaler Inhale 1-2 puffs into the lungs every 4 (four) hours as needed for wheezing. 2 each 11   Azelastine HCl 137 MCG/SPRAY SOLN 1 puff in each nostril (Patient not taking: Reported on 08/22/2022)     ketoconazole (NIZORAL) 2 % shampoo Apply 1 Application topically 2 (two) times a week. Use as body wash. (Patient not taking: Reported on 08/22/2022) 120 mL 0   No current facility-administered medications on file prior to visit.    Review of Systems As in subjective    Objective:  Physical Exam Due to coronavirus pandemic stay at home measures, patient visit was virtual and they were not examined in person.   LMP 08/08/2022 (Approximate)   Gen: wd, wn, nad No labored breathing or wheezing Some congested sounding     Assessment:     Encounter Diagnosis  Name Primary?   COVID Yes       Plan:      Covid infection, covid illness general recommendations:  I recommend you rest, hydrate well with water and clear fluids throughout the day.  If you feel dry in the mouth, tongue or feel that you are urinating as much as usual, then increase  hydration.  You urine should be like yellow to clear, not dark yellow or darker.     Pain, body aches, or fever: You can use Tylenol /Acetaminophen 344m over the counter for pain or fever, every 4-6 hours, OR You can use Ibuprofen 2059mover the counter, 3 tablets either 2 or 3 times daily for pain or fever.     Cough and congestion: You can use over the counter dayquil /Nyquil that you are using or consider sudafed as alternative. Consider short term 4 days or less nasal Afrin spray for quick relief of congestion at bedtime Use nasal saline flush to clear out mucous from nose   Shortness of breath or wheezing: You can use  you Albuterol inhaler 1-2 puffs every 4 hours as needed for wheezing, shortness of breath, chest tightness, or coughing fits.  Caution as this medication can cause jitteriness/shakes.   Nausea: You can use over the counter Emetrol for nausea.     Antiviral medication: Consider using antiviral medication Paxlovid to help reduce your risk of hospitalization or severe illness.  If medicaiton is not available, too expensive or not covered by insurance, then call [facility].    In the next few days, if you are having trouble breathing, if you are very weak, have high fever 103 or higher consistently despite Tylenol, or uncontrollable nausea and vomiting, then call or go to the emergency department.    If you have other questions or have other symptoms or questions you are concerned about then please make a virtual visit   Covid symptoms such as fatigue and cough can linger over 2 weeks, even after the initial fever, aches, chills, and other initial symptoms.   Self Quarantine: The CDC, Centers for Disease Control has recommended a self quarantine of 5 days from the start of your illness until you are symptom-free including at least 24 hours of no symptoms including no fever, no shortness of breath, and no body aches and chills, by day 5 before returning to work or  general contact with the public.  What does self quarantine mean: avoiding contact with people as much as possible.   Particularly in your house, isolate your self from others in a separate room, wear a mask when possible in the room, particularly if coughing a lot.   Have others bring food, water, medications, etc., to your door, but avoid direct contact with your household contacts during this time to avoid spreading the infection to them.   If you have a separate bathroom and living quarters during the next 2 weeks away from others, that would be preferable.    If you can't completely isolate, then wear a mask, wash hands frequently with soap and water for at least 15 seconds, minimize close contact with others, and have a friend or family member check regularly  from a distance to make sure you are not getting seriously worse.     You should not be going out in public, should not be going to stores, to work or other public places until all your symptoms have resolved and at least 5 days + 24 hours of no symptoms at all have transpired.   Ideally you should avoid contact with others for a full 5 days if possible.  One of the goals is to limit spread to high risk people; people that are older and elderly, people with multiple health issues like diabetes, heart disease, lung disease, and anybody that has weakened immune systems such as people with cancer or on immunosuppressive therapy.      Brentlee was seen today for covid positive.  Diagnoses and all orders for this visit:  COVID  Other orders -     nirmatrelvir/ritonavir (PAXLOVID) 20 x 150 MG & 10 x 100MG TABS; Take 3 tablets by mouth 2 (two) times daily for 5 days. (Take nirmatrelvir 150 mg two tablets twice daily for 5 days and ritonavir 100 mg one tablet twice daily for 5 days) Patient GFR is normal    F/u prn

## 2022-11-20 ENCOUNTER — Ambulatory Visit: Payer: 59 | Admitting: Podiatry

## 2022-11-20 DIAGNOSIS — M722 Plantar fascial fibromatosis: Secondary | ICD-10-CM

## 2022-11-20 DIAGNOSIS — G5761 Lesion of plantar nerve, right lower limb: Secondary | ICD-10-CM

## 2022-11-20 NOTE — Progress Notes (Signed)
  Subjective:  Patient ID: Kristen Morgan, female    DOB: [birth year],  MRN: [id number]  Chief Complaint  Patient presents with   Neuroma    bil knots on both feet are starting to get bigger and bothering pt/ right 2nd toe tender    47 y.o. female presents with the above complaint. History confirmed with patient.  She returns for follow-up the fibromas are getting bigger and getting more painful.  She also feels a weird sensation between the toes on the right foot which causes her toes to feel funny with pressure on it  Objective:  Physical Exam: warm, good capillary refill, no trophic changes or ulcerative lesions, normal DP and PT pulses, and normal sensory exam. Left Foot:  Solitary fibroma medial band of plantar fascia tender to palpation Right Foot:  Double lesion fibroma and medial band of plantar fascia with tenderness to palpation, she has pain to palpation of the plantar second interspace  Assessment:   1. 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  We again reviewed her treatment options for the plantar fibromas.  Becoming more painful and irritating.  We discussed corticosteroid injection therapy.  She agreed to this today and following consent and sterile prep with alcohol, each fibroma was injected separately on the left and right foot with 20 mg of Kenalog and 0.5 cc of 0.5% Marcaine plain.  She tolerated well  She has a new issue today that she did not have before with pain radiating in the second interspace plantarly on the right foot.  This is consistent with a Morton's neuroma with direct pain to palpation.  We discussed treatment of these lesions including corticosteroid injection and surgical excision if not improving.  She wanted to proceed with corticosteroid injection today.  Following sterile prep with alcohol from a dorsal approach to, 4 mg of dexamethasone and  0.5 cc of 0.5% Marcaine plain was injected into the second interspace from dorsal approach.  She tolerated this well.  It was dressed with Band-Aid.  She will return as needed for both issues.  No follow-ups on file.

## 2023-01-09 ENCOUNTER — Encounter (HOSPITAL_BASED_OUTPATIENT_CLINIC_OR_DEPARTMENT_OTHER): Payer: 59 | Admitting: Nurse Practitioner

## 2023-04-28 ENCOUNTER — Ambulatory Visit: Payer: 59 | Admitting: Podiatry

## 2023-04-28 ENCOUNTER — Ambulatory Visit: Payer: 59

## 2023-04-28 DIAGNOSIS — M7741 Metatarsalgia, right foot: Secondary | ICD-10-CM

## 2023-04-28 DIAGNOSIS — M216X1 Other acquired deformities of right foot: Secondary | ICD-10-CM | POA: Diagnosis not present

## 2023-04-28 DIAGNOSIS — G5761 Lesion of plantar nerve, right lower limb: Secondary | ICD-10-CM

## 2023-04-28 DIAGNOSIS — M7751 Other enthesopathy of right foot: Secondary | ICD-10-CM

## 2023-04-28 NOTE — Progress Notes (Signed)
  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 female presents with the above complaint. History confirmed with patient.  She returns for follow-up the pain in the right foot surgery return a few weeks ago after the injection which helped quite a bit for feels a little bit different does not feel like there is a marble under there but does feel tenderness in the joint when she gets up to walk especially when she is barefoot at home  Objective:  Physical Exam: warm, good capillary refill, no trophic changes or ulcerative lesions, normal DP and PT pulses, and normal sensory exam.  Fibromas nontender.  No pain in the second interspace to palpation today or radiation into toes, no Mulder's click or Lendell Caprice sign, there is tenderness in the plantar second MTP joint with dorsal stretch of the toe, no instability, negative Lachman exam   Radiographs of the right foot taken today show slight elongation of the second metatarsal there is close spacing of the second and third metatarsal heads  Assessment:   1. 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 Plantar flexed metatarsal, right   4. Capsulitis of metatarsophalangeal (MTP) joint of right foot      Plan:  Patient was evaluated and treated and all questions answered.  We today reviewed her radiographs we took and discussed that the neuroma itself does not appear to be painful now but she is dealing with capsulitis and inflammation of the second MTP and has slight elongation of the second MTP joint.  This is not particular painful that warranted an injection into the joint currently, we discussed offloading this with a custom molded foot orthosis with metatarsal pad and second MTP unload.  She will be scheduled to see our orthotist for this.  Long-term also discussed that  surgical correction with shortening of the metatarsal could benefit as well but only if nonsurgical treatment fails.  We also discussed that padding and cushioning the foot and avoiding barefoot on hard flooring surfaces would be beneficial as well.  She will return to see me as needed for this if the orthotics need adjustments or further treatment is necessary including injections if it worsens.   No follow-ups on file.

## 2023-04-28 NOTE — Patient Instructions (Addendum)
Z6109 is the code used for orthotics, check with your insurance to see if they cover these for the following diagnoses:

## 2023-05-05 ENCOUNTER — Ambulatory Visit (HOSPITAL_BASED_OUTPATIENT_CLINIC_OR_DEPARTMENT_OTHER): Payer: 59 | Admitting: Pulmonary Disease

## 2023-05-05 ENCOUNTER — Encounter (HOSPITAL_BASED_OUTPATIENT_CLINIC_OR_DEPARTMENT_OTHER): Payer: Self-pay | Admitting: Pulmonary Disease

## 2023-05-05 VITALS — BP 112/72 | HR 59 | Ht 63.0 in | Wt 166.0 lb

## 2023-05-05 DIAGNOSIS — Z23 Encounter for immunization: Secondary | ICD-10-CM | POA: Diagnosis not present

## 2023-05-05 DIAGNOSIS — J4520 Mild intermittent asthma, uncomplicated: Secondary | ICD-10-CM | POA: Diagnosis not present

## 2023-05-05 NOTE — Patient Instructions (Signed)
X RAST panel   X flu shot

## 2023-05-05 NOTE — Progress Notes (Signed)
   Subjective:    Patient ID: Kristen Morgan, female    DOB: Jan 12, 1976, 47 y.o.   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  Chief Complaint  Patient presents with   Follow-up    6 month follow up f, pt has no knew concerns    She was placed on Advair and when PFTs were okay, Advair was tapered off by December. She has done well since then.  She had exercise-induced symptoms especially with cold air.  As the season changed this year during fall she had some chest tightness.  She has also noted hypersensitivity to odors especially candles, lotions and remains perfume.  She has not needed albuterol in the last month and only uses it occasionally. She denies nocturnal symptoms  Environment -2 kids, a cat for 16 years   Significant tests/ events reviewed 06/2022 PFTs showed normal lung function with FEV1 at 110%, ratio 87, FVC 100%, no significant bronchodilator response.    Review of Systems neg for any significant sore throat, dysphagia, itching, sneezing, nasal congestion or excess/ purulent secretions, fever, chills, sweats, unintended wt loss, pleuritic or exertional cp, hempoptysis, orthopnea pnd or change in chronic leg swelling. Also denies presyncope, palpitations, heartburn, abdominal pain, nausea, vomiting, diarrhea or change in bowel or urinary habits, dysuria,hematuria, rash, arthralgias, visual complaints, headache, numbness weakness or ataxia.     Objective:   Physical Exam  Gen. Pleasant, well-nourished, in no distress ENT - no thrush, no pallor/icterus,no post nasal drip Neck: No JVD, no thyromegaly, no carotid bruits Lungs: no use of accessory muscles, no dullness to percussion, clear  without rales or rhonchi  Cardiovascular: Rhythm regular, heart sounds  normal, no murmurs or gallops, no peripheral edema Musculoskeletal: No deformities, no cyanosis or clubbing        Assessment & Plan:

## 2023-05-05 NOTE — Assessment & Plan Note (Signed)
Her symptoms are more consistent with reactive airways rather than true asthma.  She also seems to have hypersensitivity to orders. We will check her RAST panel to see if we can identify any fall related allergens since her symptoms appear to be seasonal.  She may also have some exercise-induced symptoms especially with cold air exposure Flu shot today

## 2023-05-08 LAB — ALLERGEN MILK: Milk IgE: <0.1 kU/L

## 2023-05-09 ENCOUNTER — Encounter (HOSPITAL_BASED_OUTPATIENT_CLINIC_OR_DEPARTMENT_OTHER): Payer: Self-pay | Admitting: Pulmonary Disease

## 2023-05-14 NOTE — Telephone Encounter (Signed)
Patient is concerned about her labs. She was supposed to get tested for allergies but was tested for a milk allergy instead. Please give a patient a call back for clarification on her lab test and results. [phone]

## 2023-05-21 ENCOUNTER — Ambulatory Visit: Payer: 59

## 2023-05-21 NOTE — Progress Notes (Signed)
  Patient was seen, measured / scanned for custom molded foot orthotics.  Patient will benefit from CFO's as they will help provide total contact to MLA's helping to better distribute body weight across BIL feet greater reducing plantar pressure and pain and to also encourage FF and RF alignment.  Patient was scanned items to be ordered and fit when in  [employer], CFo, CFm

## 2023-05-21 NOTE — Telephone Encounter (Signed)
Pt calling back to check on her lab results

## 2023-05-25 ENCOUNTER — Ambulatory Visit (INDEPENDENT_AMBULATORY_CARE_PROVIDER_SITE_OTHER): Payer: 59 | Admitting: Nurse Practitioner

## 2023-05-25 ENCOUNTER — Encounter: Payer: Self-pay | Admitting: Nurse Practitioner

## 2023-05-25 VITALS — BP 120/78 | HR 58 | Ht 63.5 in | Wt 166.4 lb

## 2023-05-25 DIAGNOSIS — J4520 Mild intermittent asthma, uncomplicated: Secondary | ICD-10-CM

## 2023-05-25 DIAGNOSIS — Z0000 Encounter for general adult medical examination without abnormal findings: Secondary | ICD-10-CM | POA: Diagnosis not present

## 2023-05-25 DIAGNOSIS — Z23 Encounter for immunization: Secondary | ICD-10-CM | POA: Diagnosis not present

## 2023-05-25 DIAGNOSIS — E039 Hypothyroidism, unspecified: Secondary | ICD-10-CM | POA: Insufficient documentation

## 2023-05-25 MED ORDER — LIOTHYRONINE SODIUM 5 MCG PO TABS: 5.0000 ug | ORAL_TABLET | Freq: Two times a day (BID) | ORAL | 1 refills | Status: AC

## 2023-05-25 MED ORDER — SYNTHROID 50 MCG PO TABS: 50.0000 ug | ORAL_TABLET | Freq: Every day | ORAL | 1 refills | Status: DC

## 2023-05-25 NOTE — Progress Notes (Addendum)
Shawna Clamp, DNP, AGNP-c [facility] [address] [place], [place] [address] Main Office [phone]  BP 120/78   Pulse (!) 58   Ht 5' 3.5" (1.613 m)   Wt 166 lb 6.4 oz (75.5 kg)   LMP 05/22/2023   BMI 29.01 kg/m    Subjective:    Patient ID: Kristen Morgan, female    DOB: 17-Jul-1975, 47 y.o.   MRN: [id number]  HPI: Kristen Morgan is a 47 y.o. female presenting on 05/25/2023 for comprehensive medical examination.   History of Present Illness Kristen Morgan, with a history of thyroid issues managed with Cytomel and Synthroid, reports feeling generally well with no significant symptoms related to her thyroid condition. She has been under the care of a weight loss clinic [facility]), where she successfully lost forty pounds. The patient reports no significant changes in her thyroid symptoms, despite the weight loss and changes in her diet.  The patient also has a history of respiratory issues, which have been managed with Advair. She reports occasional tightness in the chest, which she attributes to certain scents or environmental allergens. These episodes are infrequent and self-resolving, requiring only occasional use of an inhaler. The patient has been evaluated by a pulmonologist, who suggested allergy testing.  The patient has a history of heavy menstrual periods, which have been managed with a Mirena IUD. She reports that her periods have become lighter and less regular since the IUD placement. She also reports a history of abnormal Pap smears, which required further investigation and treatment.  The patient also reports occasional gastrointestinal issues, which she attributes to certain foods, particularly sugary milk products. She has a history of lactose intolerance and has made dietary adjustments to manage this.  The patient denies any changes in bowel or bladder habits, chest pain, palpitations, ear or hearing issues, or significant changes in moles or  skin lesions. She reports no significant changes in vision, although she has noticed some difficulty with reading small print. She denies any swelling in her feet or ankles. She reports no significant changes in her general health status since her last visit.  Pertinent items are noted in HPI.  IMMUNIZATIONS:   Flu Vaccine: Flu vaccine completed elsewhere this season. Record updated. Prevnar 13: Prevnar 13 N/A for this patient Prevnar 20: Prevnar 20 N/A for this patient Pneumovax 23: Pneumovax 23 N/A for this patient Vac Shingrix: Shingrix N/A for this patient HPV: N/A or Aged Out Tetanus: Tetanus due, given today  COVID: Due, completed today RSV: No  HEALTH MAINTENANCE: Pap Smear HM Status: is up to date Mammogram HM Status: is up to date Colon Cancer Screening HM Status: is up to date Bone Density HM Status: N/A STI Testing HM Status: was declined  Lung CT HM Status: N/A  Concerns with vision, hearing, or dentition: No  Most Recent Depression Screen:     05/25/2023   10:26 AM 05/21/2022    8:19 AM 03/25/2022    8:24 AM 01/06/2022    9:32 AM 09/09/2021    6:18 PM  Depression screen PHQ 2/9  Decreased Interest 0 0 0 0 0  Down, Depressed, Hopeless 0 0 0 1 0  PHQ - 2 Score 0 0 0 1 0  Altered sleeping  1 0 1 1  Tired, decreased energy  1 0 1 1  Change in appetite  0 0 1 0  Feeling bad or failure about yourself   0 0 0 0  Trouble concentrating  0  0 0 1  Moving slowly or fidgety/restless  0 0 0 0  Suicidal thoughts  0 0 0 0  PHQ-9 Score  2 0 4 3  Difficult doing work/chores   Not difficult at all Not difficult at all Not difficult at all   Most Recent Anxiety Screen:     05/21/2022    8:18 AM 09/09/2021    6:19 PM  GAD 7 : Generalized Anxiety Score  Nervous, Anxious, on Edge 0 1  Control/stop worrying 0 0  Worry too much - different things 0 0  Trouble relaxing 0 0  Restless 0 0  Easily annoyed or irritable 0 1  Afraid - awful might happen 0 0  Total GAD 7 Score 0 2   Anxiety Difficulty Not difficult at all Not difficult at all   Most Recent Fall Screen:    05/25/2023   10:26 AM 05/21/2022    8:19 AM 03/25/2022    8:24 AM 01/06/2022    9:32 AM 09/09/2021    6:18 PM  Fall Risk   Falls in the past year? 0 0 0 0 0  Number falls in past yr: 0  0 0 0  Injury with Fall? 0  0 0 0  Risk for fall due to : No Fall Risks  No Fall Risks Orthopedic patient No Fall Risks  Follow up Falls evaluation completed  Falls evaluation completed Falls evaluation completed;Education provided Falls evaluation completed    Past medical history, surgical history, medications, allergies, family history and social history reviewed with patient today and changes made to appropriate areas of the chart.  Past Medical History:  Past Medical History:  Diagnosis Date   Arthritis    back/ right hip   Bloating 08/13/2017   Change in bowel habits 08/13/2017   IBS (irritable bowel syndrome)    Pneumonia of both lungs due to infectious organism 03/25/2022   Subacute cough 04/08/2022   Thyroid disease    Tinea versicolor 05/21/2022   Tubular adenoma of colon 08/2017   Medications:  Current Outpatient Medications on File Prior to Visit  Medication Sig   Azelastine HCl 137 MCG/SPRAY SOLN    levonorgestrel (MIRENA) 20 MCG/DAY IUD 1 each by Intrauterine route once.   Multiple Vitamin (MULTIVITAMIN) tablet Take 1 tablet by mouth daily.   Probiotic Product (PROBIOTIC-10 PO) Take 1 capsule by mouth daily.   [DISCONTINUED] albuterol (VENTOLIN HFA) 108 (90 Base) MCG/ACT inhaler Inhale 1-2 puffs into the lungs every 4 (four) hours as needed for wheezing.   No current facility-administered medications on file prior to visit.   Surgical History:  Past Surgical History:  Procedure Laterality Date   CERVICAL CONIZATION W/BX N/A 06/15/2019   Procedure: CONIZATION CERVIX WITH BIOPSY;  Surgeon: Geryl Rankins, MD;  Location: [facility];  Service: Gynecology;  Laterality: N/A;    CESAREAN SECTION     x 2   COLONOSCOPY     DILATATION & CURETTAGE/HYSTEROSCOPY WITH MYOSURE N/A 06/15/2019   Procedure: DILATATION & CURETTAGE/HYSTEROSCOPY WITH MYOSURE;  Surgeon: Geryl Rankins, MD;  Location: [facility];  Service: Gynecology;  Laterality: N/A;   INTRAUTERINE DEVICE (IUD) INSERTION N/A 06/15/2019   Procedure: INTRAUTERINE DEVICE (IUD) INSERTION;  Surgeon: Geryl Rankins, MD;  Location: [facility] [place];  Service: Gynecology;  Laterality: N/A;   LEEP     Allergies:  Allergies  Allergen Reactions   Sulfa Antibiotics Hives and Rash   Family History:  Family History  Problem Relation Age  of Onset   Thyroid disease Mother    Arthritis Father    Thyroid disease Maternal Aunt    Diabetes Paternal Uncle    Thyroid disease Maternal Grandmother    Colon cancer Maternal Grandfather    Diabetes Paternal Grandmother    Breast cancer Neg Hx    Esophageal cancer Neg Hx    Rectal cancer Neg Hx    Stomach cancer Neg Hx        Objective:    BP 120/78   Pulse (!) 58   Ht 5' 3.5" (1.613 m)   Wt 166 lb 6.4 oz (75.5 kg)   LMP 05/22/2023   BMI 29.01 kg/m   Wt Readings from Last 3 Encounters:  05/25/23 166 lb 6.4 oz (75.5 kg)  05/05/23 166 lb (75.3 kg)  08/12/22 174 lb (78.9 kg)    Physical Exam Vitals and nursing note reviewed.  Constitutional:      General: She is not in acute distress.    Appearance: Normal appearance.  HENT:     Head: Normocephalic and atraumatic.     Right Ear: Hearing, tympanic membrane, ear canal and external ear normal.     Left Ear: Hearing, tympanic membrane, ear canal and external ear normal.     Nose: Nose normal.     Right Sinus: No maxillary sinus tenderness or frontal sinus tenderness.     Left Sinus: No maxillary sinus tenderness or frontal sinus tenderness.     Mouth/Throat:     Lips: Pink.     Mouth: Mucous membranes are moist.     Pharynx: Oropharynx is clear.  Eyes:     General: Lids are  normal. Vision grossly intact.     Extraocular Movements: Extraocular movements intact.     Conjunctiva/sclera: Conjunctivae normal.     Pupils: Pupils are equal, round, and reactive to light.     Funduscopic exam:    Right eye: Red reflex present.        Left eye: Red reflex present.    Visual Fields: Right eye visual fields normal and left eye visual fields normal.  Neck:     Thyroid: No thyromegaly.     Vascular: No carotid bruit.  Cardiovascular:     Rate and Rhythm: Normal rate and regular rhythm.     Chest Wall: PMI is not displaced.     Pulses: Normal pulses.          Dorsalis pedis pulses are 2+ on the right side and 2+ on the left side.       Posterior tibial pulses are 2+ on the right side and 2+ on the left side.     Heart sounds: Normal heart sounds. No murmur heard. Pulmonary:     Effort: Pulmonary effort is normal. No respiratory distress.     Breath sounds: Normal breath sounds.  Abdominal:     General: Abdomen is flat. Bowel sounds are normal. There is no distension.     Palpations: Abdomen is soft. There is no hepatomegaly, splenomegaly or mass.     Tenderness: There is no abdominal tenderness. There is no right CVA tenderness, left CVA tenderness, guarding or rebound.  Musculoskeletal:        General: Normal range of motion.     Cervical back: Full passive range of motion without pain, normal range of motion and neck supple. No tenderness.     Right lower leg: No edema.     Left lower leg: No edema.  Feet:  Left foot:     Toenail Condition: Left toenails are normal.  Lymphadenopathy:     Cervical: No cervical adenopathy.     Upper Body:     Right upper body: No supraclavicular adenopathy.     Left upper body: No supraclavicular adenopathy.  Skin:    General: Skin is warm and dry.     Capillary Refill: Capillary refill takes less than 2 seconds.     Nails: There is no clubbing.  Neurological:     General: No focal deficit present.     Mental Status: She  is alert and oriented to person, place, and time.     GCS: GCS eye subscore is 4. GCS verbal subscore is 5. GCS motor subscore is 6.     Sensory: Sensation is intact.     Motor: Motor function is intact.     Coordination: Coordination is intact.     Gait: Gait is intact.     Deep Tendon Reflexes: Reflexes are normal and symmetric.  Psychiatric:        Attention and Perception: Attention normal.        Mood and Affect: Mood normal.        Speech: Speech normal.        Behavior: Behavior normal. Behavior is cooperative.        Thought Content: Thought content normal.        Cognition and Memory: Cognition and memory normal.        Judgment: Judgment normal.      Results for orders placed or performed in visit on 05/05/23  Milk IgE  Result Value Ref Range   Milk IgE <0.10 Class 0 kU/L       Assessment & Plan:   Problem List Items Addressed This Visit     Reactive airway disease with wheezing    Recent visit with Dr. Vassie Loll showed possible reactive airway disease. He recommended specific lab testing for IgE mediated allergens. I will add this test onto the labs today since we are drawing here for her physical. I have let Dr. Vassie Loll know and will send this to him for review when the results have returned.       Relevant Orders   Allergens w/Total IgE Area 2   Encounter for annual physical exam - Primary    CPE completed today. Review of HM activities and recommendations discussed and provided on AVS. Anticipatory guidance, diet, and exercise recommendations provided. Medications, allergies, and hx reviewed and updated as necessary. Orders placed as listed below.  Plan: - Labs ordered. Will make changes as necessary based on results.  - I will review these results and send recommendations via MyChart or a telephone call.  - F/U with CPE in 1 year or sooner for acute/chronic health needs as directed.        Relevant Orders   CBC with Differential/Platelet   CMP14+EGFR    Hemoglobin A1c   Lipid panel   Acquired hypothyroidism    She has historically been seen by [facility] for management of this. Right now she is not having any symptoms and doing well on the medication. We will plan to continue on her current dose and monitor labs today. OK to end refills as needed for 1 year.       Relevant Medications   liothyronine (CYTOMEL) 5 MCG tablet   SYNTHROID 50 MCG tablet   Other Relevant Orders   TSH   Other Visit Diagnoses  Need for Tdap vaccination       Need for COVID-19 vaccine       Relevant Orders   Pfizer Comirnaty Covid -19 Vaccine 69yrs and older (Completed)         Follow up plan: Return in about 1 year (around 05/24/2024) for CPE.  NEXT PREVENTATIVE PHYSICAL DUE IN 1 YEAR.  PATIENT COUNSELING PROVIDED FOR ALL ADULT PATIENTS: A well balanced diet low in saturated fats, cholesterol, and moderation in carbohydrates.  This can be as simple as monitoring portion sizes and cutting back on sugary beverages such as soda and juice to start with.    Daily water consumption of at least 64 ounces.  Physical activity at least 180 minutes per week.  If just starting out, start 10 minutes a day and work your way up.   This can be as simple as taking the stairs instead of the elevator and walking 2-3 laps around the office purposefully every day.   STD protection, partner selection, and regular testing if high risk.  Limited consumption of alcoholic beverages if alcohol is consumed. For men, I recommend no more than 14 alcoholic beverages per week, spread out throughout the week (max 2 per day). Avoid "binge" drinking or consuming large quantities of alcohol in one setting.  Please let me know if you feel you may need help with reduction or quitting alcohol consumption.   Avoidance of nicotine, if used. Please let me know if you feel you may need help with reduction or quitting nicotine use.   Daily mental health attention. This can be in the  form of 5 minute daily meditation, prayer, journaling, yoga, reflection, etc.  Purposeful attention to your emotions and mental state can significantly improve your overall wellbeing and Health.  Please know that I am here to help you with all of your health care goals and am happy to work with you to find a solution that works best for you.  The greatest advice I have received with any changes in life are to take it one step at a time, that even means if all you can focus on is the next 60 seconds, then do that and celebrate your victories.  With any changes in life, you will have set backs, and that is OK. The important thing to remember is, if you have a set back, it is not a failure, it is an opportunity to try again! Screening Testing Mammogram Every 1 -2 years based on history and risk factors Starting at age 62 Pap Smear Ages 21-39 every 3 years Ages 108-65 every 5 years with HPV testing More frequent testing may be required based on results and history Colon Cancer Screening Every 1-10 years based on test performed, risk factors, and history Starting at age 83 Bone Density Screening Every 2-10 years based on history Starting at age 50 for women Recommendations for men differ based on medication usage, history, and risk factors AAA Screening One time ultrasound Men 20-42 years old who have every smoked Lung Cancer Screening Low Dose Lung CT every 12 months Age 80-80 years with a 30 pack-year smoking history who still smoke or who have quit within the last 15 years   Screening Labs Routine  Labs: Complete Blood Count (CBC), Complete Metabolic Panel (CMP), Cholesterol (Lipid Panel) Every 6-12 months based on history and medications May be recommended more frequently based on current conditions or previous results Hemoglobin A1c Lab Every 3-12 months based on history and previous results Starting  at age 8 or earlier with diagnosis of diabetes, high cholesterol, BMI >26, and/or  risk factors Frequent monitoring for patients with diabetes to ensure blood sugar control Thyroid Panel (TSH) Every 6 months based on history, symptoms, and risk factors May be repeated more often if on medication HIV One time testing for all patients 26 and older May be repeated more frequently for patients with increased risk factors or exposure Hepatitis C One time testing for all patients 16 and older May be repeated more frequently for patients with increased risk factors or exposure Gonorrhea, Chlamydia Every 12 months for all sexually active persons 13-24 years Additional monitoring may be recommended for those who are considered high risk or who have symptoms Every 12 months for any woman on birth control, regardless of sexual activity PSA Men 67-57 years old with risk factors Additional screening may be recommended from age 47-69 based on risk factors, symptoms, and history  Vaccine Recommendations Tetanus Booster All adults every 10 years Flu Vaccine All patients 6 months and older every year COVID Vaccine All patients 12 years and older Initial dosing with booster May recommend additional booster based on age and health history HPV Vaccine 2 doses all patients age 29-26 Dosing may be considered for patients over 26 Shingles Vaccine (Shingrix) 2 doses all adults 55 years and older Pneumonia (Pneumovax 67) All adults 65 years and older May recommend earlier dosing based on health history One year apart from Prevnar 45 Pneumonia (Prevnar 46) All adults 65 years and older Dosed 1 year after Pneumovax 23 Pneumonia (Prevnar 20) One time alternative to the two dosing of 13 and 23 For all adults with initial dose of 23, 20 is recommended 1 year later For all adults with initial dose of 13, 23 is still recommended as second option 1 year later

## 2023-05-25 NOTE — Patient Instructions (Signed)
Everything looks great today!! I will let you know what your labs show and if we need to change anything I will let you know. I have ordered the lab test recommended by Dr. Vassie Loll and send these results to him to make sure he sees these.    For all adult patients, I recommend A well balanced diet low in saturated fats, cholesterol, and moderation in carbohydrates.   This can be as simple as monitoring portion sizes and cutting back on sugary beverages such as soda and juice to start with.    Daily water consumption of at least 64 ounces.  Physical activity at least 180 minutes per week, if just starting out.   This can be as simple as taking the stairs instead of the elevator and walking 2-3 laps around the office  purposefully every day.   STD protection, partner selection, and regular testing if high risk.  Limited consumption of alcoholic beverages if alcohol is consumed.  For women, I recommend no more than 7 alcoholic beverages per week, spread out throughout the week.  Avoid "binge" drinking or consuming large quantities of alcohol in one setting.   Please let me know if you feel you may need help with reduction or quitting alcohol consumption.   Avoidance of nicotine, if used.  Please let me know if you feel you may need help with reduction or quitting nicotine use.   Daily mental health attention.  This can be in the form of 5 minute daily meditation, prayer, journaling, yoga, reflection, etc.   Purposeful attention to your emotions and mental state can significantly improve your overall wellbeing  and  Health.  Please know that I am here to help you with all of your health care goals and am happy to work with you to find a solution that works best for you.  The greatest advice I have received with any changes in life are to take it one step at a time, that even means if all you can focus on is the next 60 seconds, then do that and celebrate your victories.  With any changes in  life, you will have set backs, and that is OK. The important thing to remember is, if you have a set back, it is not a failure, it is an opportunity to try again!  Health Maintenance Recommendations Screening Testing Mammogram Every 1 -2 years based on history and risk factors Starting at age 74 Pap Smear Ages 21-39 every 3 years Ages 42-65 every 5 years with HPV testing More frequent testing may be required based on results and history Colon Cancer Screening Every 1-10 years based on test performed, risk factors, and history Starting at age 39 Bone Density Screening Every 2-10 years based on history Starting at age 51 for women Recommendations for men differ based on medication usage, history, and risk factors AAA Screening One time ultrasound Men 60-39 years old who have every smoked Lung Cancer Screening Low Dose Lung CT every 12 months Age 10-80 years with a 30 pack-year smoking history who still smoke or who have quit within the last 15 years  Screening Labs Routine  Labs: Complete Blood Count (CBC), Complete Metabolic Panel (CMP), Cholesterol (Lipid Panel) Every 6-12 months based on history and medications May be recommended more frequently based on current conditions or previous results Hemoglobin A1c Lab Every 3-12 months based on history and previous results Starting at age 69 or earlier with diagnosis of diabetes, high cholesterol, BMI >26, and/or risk  factors Frequent monitoring for patients with diabetes to ensure blood sugar control Thyroid Panel (TSH w/ T3 & T4) Every 6 months based on history, symptoms, and risk factors May be repeated more often if on medication HIV One time testing for all patients 62 and older May be repeated more frequently for patients with increased risk factors or exposure Hepatitis C One time testing for all patients 67 and older May be repeated more frequently for patients with increased risk factors or exposure Gonorrhea,  Chlamydia Every 12 months for all sexually active persons 13-24 years Additional monitoring may be recommended for those who are considered high risk or who have symptoms PSA Men 105-67 years old with risk factors Additional screening may be recommended from age 64-69 based on risk factors, symptoms, and history  Vaccine Recommendations Tetanus Booster All adults every 10 years Flu Vaccine All patients 6 months and older every year COVID Vaccine All patients 12 years and older Initial dosing with booster May recommend additional booster based on age and health history HPV Vaccine 2 doses all patients age 12-26 Dosing may be considered for patients over 26 Shingles Vaccine (Shingrix) 2 doses all adults 55 years and older Pneumonia (Pneumovax 23) All adults 65 years and older May recommend earlier dosing based on health history Pneumonia (Prevnar 62) All adults 65 years and older Dosed 1 year after Pneumovax 23  Additional Screening, Testing, and Vaccinations may be recommended on an individualized basis based on family history, health history, risk factors, and/or exposure.

## 2023-05-25 NOTE — Telephone Encounter (Signed)
I am seeing her today for her CPE and will get this lab for you. I will send the results your way. Minna Merritts

## 2023-05-25 NOTE — Assessment & Plan Note (Signed)
Recent visit with Dr. Vassie Loll showed possible reactive airway disease. He recommended specific lab testing for IgE mediated allergens. I will add this test onto the labs today since we are drawing here for her physical. I have let Dr. Vassie Loll know and will send this to him for review when the results have returned.

## 2023-05-25 NOTE — Assessment & Plan Note (Signed)

## 2023-05-25 NOTE — Assessment & Plan Note (Addendum)
She has historically been seen by [facility] for management of this. Right now she is not having any symptoms and doing well on the medication. We will plan to continue on her current dose and monitor labs today. OK to end refills as needed for 1 year.

## 2023-05-28 LAB — CBC WITH DIFFERENTIAL/PLATELET
Basophils Absolute: 0 10*3/uL (ref 0.0–0.2)
Basos: 0 %
EOS (ABSOLUTE): 0.1 10*3/uL (ref 0.0–0.4)
Eos: 2 %
Hematocrit: 44.5 % (ref 34.0–46.6)
Hemoglobin: 15 g/dL (ref 11.1–15.9)
Immature Grans (Abs): 0 10*3/uL (ref 0.0–0.1)
Immature Granulocytes: 0 %
Lymphocytes Absolute: 1.9 10*3/uL (ref 0.7–3.1)
Lymphs: 27 %
MCH: 32.2 pg (ref 26.6–33.0)
MCHC: 33.7 g/dL (ref 31.5–35.7)
MCV: 96 fL (ref 79–97)
Monocytes Absolute: 0.5 10*3/uL (ref 0.1–0.9)
Monocytes: 7 %
Neutrophils Absolute: 4.3 10*3/uL (ref 1.4–7.0)
Neutrophils: 64 %
Platelets: 233 10*3/uL (ref 150–450)
RBC: 4.66 x10E6/uL (ref 3.77–5.28)
RDW: 11.8 % (ref 11.7–15.4)
WBC: 6.8 10*3/uL (ref 3.4–10.8)

## 2023-05-28 LAB — ALLERGENS W/TOTAL IGE AREA 2

## 2023-05-28 LAB — HEMOGLOBIN A1C
Est. average glucose Bld gHb Est-mCnc: 105 mg/dL
Hgb A1c MFr Bld: 5.3 % (ref 4.8–5.6)

## 2023-05-28 LAB — CMP14+EGFR
ALT: 16 [IU]/L (ref 0–32)
AST: 21 IU/L (ref 0–40)
Albumin: 4.7 g/dL (ref 3.9–4.9)
Alkaline Phosphatase: 41 IU/L — ABNORMAL LOW (ref 44–121)
BUN/Creatinine Ratio: 16 (ref 9–23)
BUN: 13 mg/dL (ref 6–24)
Bilirubin Total: 0.8 mg/dL (ref 0.0–1.2)
CO2: 23 mmol/L (ref 20–29)
Calcium: 9.6 mg/dL (ref 8.7–10.2)
Chloride: 103 mmol/L (ref 96–106)
Creatinine, Ser: 0.79 mg/dL (ref 0.57–1.00)
Globulin, Total: 2.2 g/dL (ref 1.5–4.5)
Glucose: 76 mg/dL (ref 70–99)
Potassium: 4.5 mmol/L (ref 3.5–5.2)
Sodium: 141 mmol/L (ref 134–144)
Total Protein: 6.9 g/dL (ref 6.0–8.5)
eGFR: 92 mL/min/{1.73_m2} (ref >59)

## 2023-05-28 LAB — LIPID PANEL
Chol/HDL Ratio: 2.7 ratio (ref 0.0–4.4)
Cholesterol, Total: 173 mg/dL (ref 100–199)
HDL: 64 mg/dL (ref >39)
LDL Chol Calc (NIH): 97 mg/dL (ref 0–99)
Triglycerides: 62 mg/dL (ref 0–149)
VLDL Cholesterol Cal: 12 mg/dL (ref 5–40)

## 2023-05-28 LAB — TSH: TSH: 1.05 u[IU]/mL (ref 0.450–4.500)

## 2023-06-12 ENCOUNTER — Other Ambulatory Visit: Payer: Self-pay | Admitting: Nurse Practitioner

## 2023-06-12 DIAGNOSIS — Z1231 Encounter for screening mammogram for malignant neoplasm of breast: Secondary | ICD-10-CM

## 2023-06-23 IMAGING — DX DG HIP (WITH OR WITHOUT PELVIS) 4+V*R*
4 series · 4 of 4 positions shown · non-contrast
Comparison: None.

CLINICAL DATA: Chronic right hip pain.  No known injury.

EXAM:
DG HIP (WITH OR WITHOUT PELVIS) 4+V RIGHT

[pelvis ap]
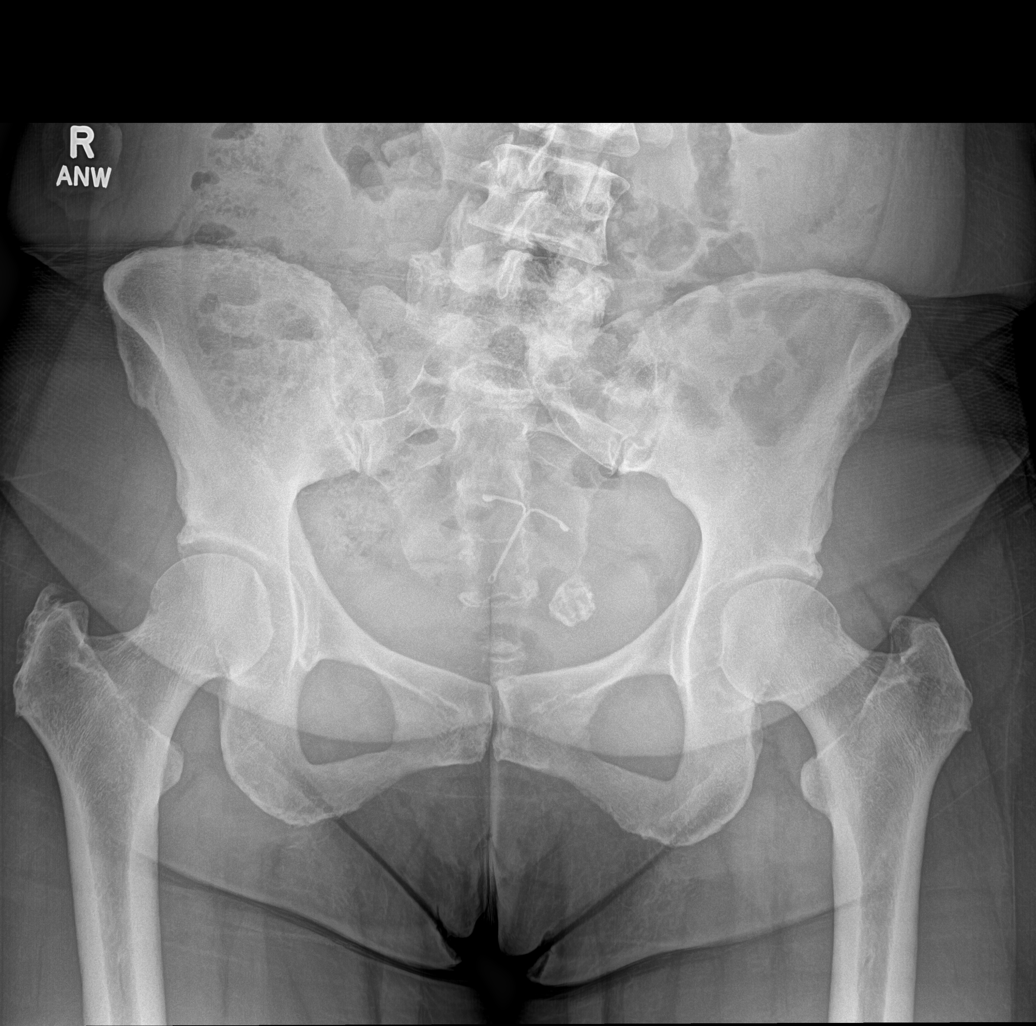

[hip ap]
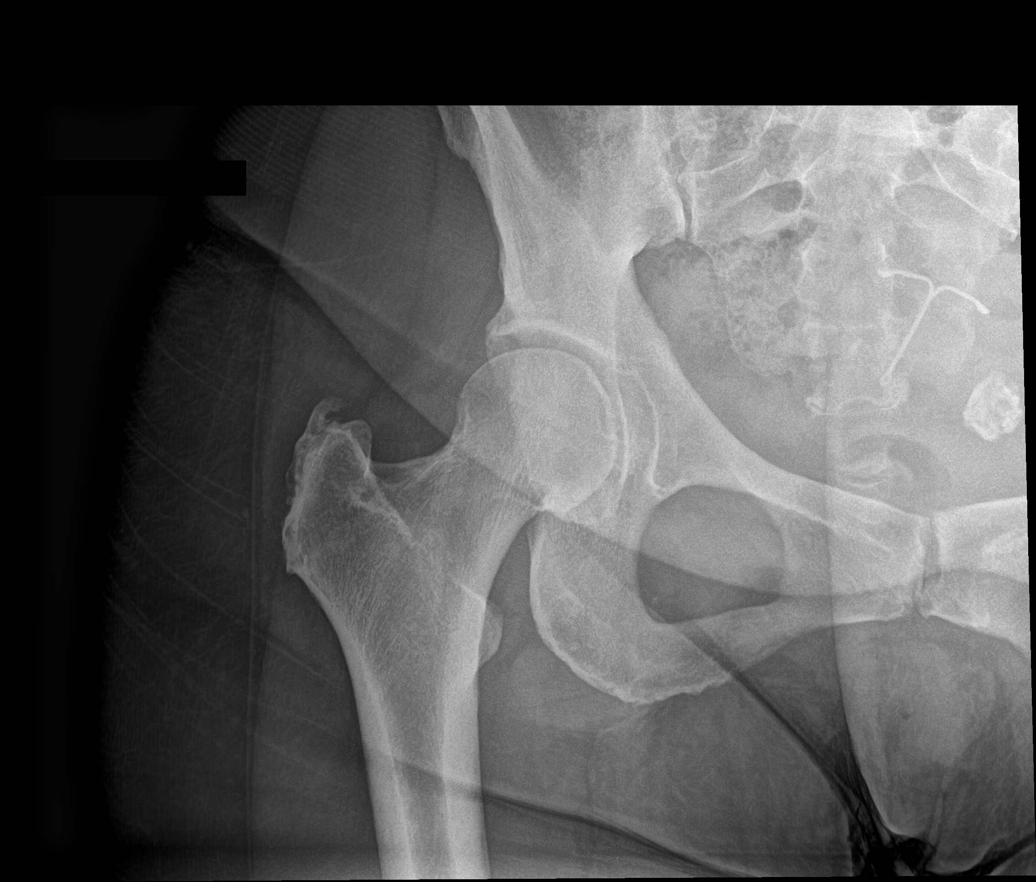

[hip lat]
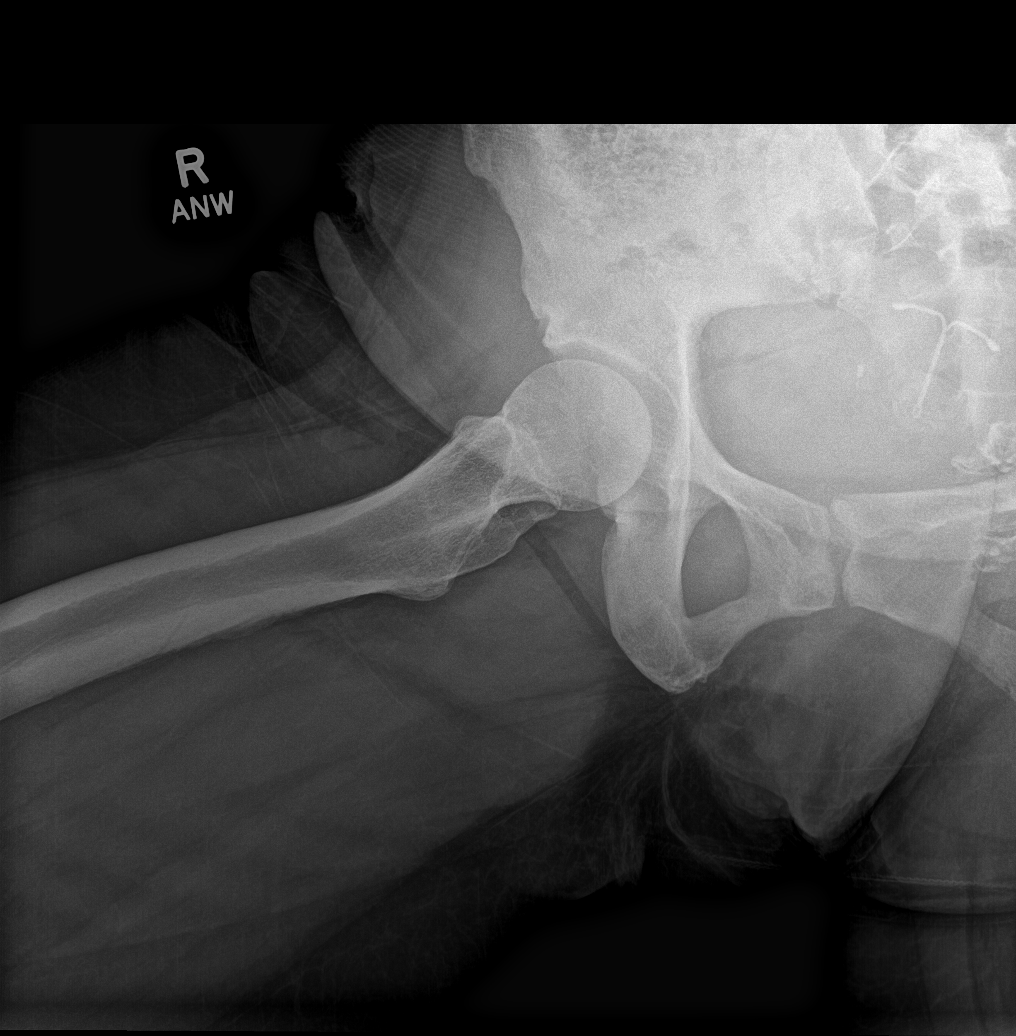

[hip axial]
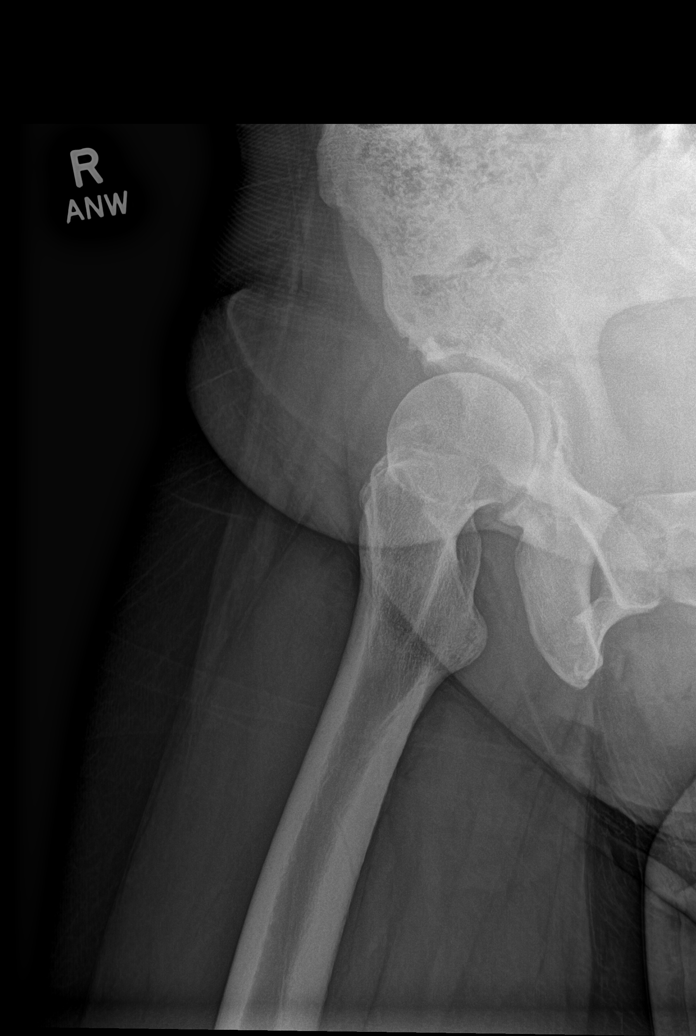

[4 of 4 positions shown; findings below may reference images not displayed]

FINDINGS: The bilateral femoroacetabular joint spaces are maintained. Mild
bilateral sacroiliac joint subchondral sclerosis degenerative
change. Mild pubic symphysis joint space narrowing, subchondral
sclerosis and peripheral osteophytosis degenerative change.

Mild levocurvature centered at L2-3 with mild right L2-3 disc space
narrowing.

An IUD overlies the midline pelvis.

There is a popcorn like approximate 2.1 cm calcification within the
left hemipelvis, presumably a calcified fibroid.
IMPRESSION: No significant right femoroacetabular osteoarthritis.

Mild bilateral sacroiliac and mild pubic symphysis osteoarthritis.

## 2023-07-14 ENCOUNTER — Ambulatory Visit: Payer: 59

## 2023-07-14 ENCOUNTER — Ambulatory Visit
Admission: RE | Admit: 2023-07-14 | Discharge: 2023-07-14 | Disposition: A | Discharge status: Home / Self Care | Payer: 59 | Source: Ambulatory Visit | Attending: Nurse Practitioner

## 2023-07-14 DIAGNOSIS — Z1231 Encounter for screening mammogram for malignant neoplasm of breast: Secondary | ICD-10-CM

## 2023-07-14 NOTE — Progress Notes (Signed)
 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   Orthotics were dispensed and fit was satisfactory. Reviewed instructions for break-in and wear. Written instructions given to patient.  Patient will follow up as needed.  Britton Cane CPed, CFo, CFm

## 2023-07-21 ENCOUNTER — Other Ambulatory Visit: Payer: 59

## 2024-03-08 LAB — HM PAP SMEAR: HPV, high-risk: NEGATIVE

## 2024-05-12 ENCOUNTER — Ambulatory Visit (HOSPITAL_BASED_OUTPATIENT_CLINIC_OR_DEPARTMENT_OTHER): Admitting: Pulmonary Disease

## 2024-05-24 NOTE — Progress Notes (Unsigned)
 Covid: Flu: Pneumonia: PAP:

## 2024-05-25 ENCOUNTER — Encounter: Payer: Self-pay | Admitting: Nurse Practitioner

## 2024-05-25 ENCOUNTER — Ambulatory Visit (INDEPENDENT_AMBULATORY_CARE_PROVIDER_SITE_OTHER): Payer: 59 | Admitting: Nurse Practitioner

## 2024-05-25 VITALS — BP 116/78 | HR 60 | Ht 64.0 in | Wt 162.8 lb

## 2024-05-25 DIAGNOSIS — E039 Hypothyroidism, unspecified: Secondary | ICD-10-CM | POA: Diagnosis not present

## 2024-05-25 DIAGNOSIS — Z130 Encounter for screening for diseases of the blood and blood-forming organs and certain disorders involving the immune mechanism: Secondary | ICD-10-CM

## 2024-05-25 DIAGNOSIS — Z1329 Encounter for screening for other suspected endocrine disorder: Secondary | ICD-10-CM | POA: Diagnosis not present

## 2024-05-25 DIAGNOSIS — J4520 Mild intermittent asthma, uncomplicated: Secondary | ICD-10-CM

## 2024-05-25 DIAGNOSIS — Z23 Encounter for immunization: Secondary | ICD-10-CM | POA: Diagnosis not present

## 2024-05-25 DIAGNOSIS — M62838 Other muscle spasm: Secondary | ICD-10-CM | POA: Diagnosis not present

## 2024-05-25 DIAGNOSIS — Z0000 Encounter for general adult medical examination without abnormal findings: Secondary | ICD-10-CM

## 2024-05-25 DIAGNOSIS — Z1321 Encounter for screening for nutritional disorder: Secondary | ICD-10-CM

## 2024-05-25 DIAGNOSIS — Z13228 Encounter for screening for other metabolic disorders: Secondary | ICD-10-CM

## 2024-05-25 LAB — CBC WITH DIFFERENTIAL/PLATELET
Basophils Absolute: 0 x10E3/uL (ref 0.0–0.2)
Basos: 0 %
EOS (ABSOLUTE): 0.1 x10E3/uL (ref 0.0–0.4)
Eos: 2 %
Hematocrit: 43.2 % (ref 34.0–46.6)
Hemoglobin: 14.8 g/dL (ref 11.1–15.9)
Immature Grans (Abs): 0 x10E3/uL (ref 0.0–0.1)
Immature Granulocytes: 0 %
Lymphocytes Absolute: 2.1 x10E3/uL (ref 0.7–3.1)
Lymphs: 28 %
MCH: 33.3 pg — ABNORMAL HIGH (ref 26.6–33.0)
MCHC: 34.3 g/dL (ref 31.5–35.7)
MCV: 97 fL (ref 79–97)
Monocytes Absolute: 0.7 x10E3/uL (ref 0.1–0.9)
Monocytes: 9 %
Neutrophils Absolute: 4.5 x10E3/uL (ref 1.4–7.0)
Neutrophils: 61 %
Platelets: 251 x10E3/uL (ref 150–450)
RBC: 4.45 x10E6/uL (ref 3.77–5.28)
RDW: 11.8 % (ref 11.7–15.4)
WBC: 7.4 x10E3/uL (ref 3.4–10.8)

## 2024-05-25 LAB — LIPID PANEL
Chol/HDL Ratio: 2.4 ratio (ref 0.0–4.4)
Cholesterol, Total: 158 mg/dL (ref 100–199)
HDL: 65 mg/dL (ref >39)
LDL Chol Calc (NIH): 80 mg/dL (ref 0–99)
Triglycerides: 68 mg/dL (ref 0–149)
VLDL Cholesterol Cal: 13 mg/dL (ref 5–40)

## 2024-05-25 LAB — CMP14+EGFR
ALT: 15 IU/L (ref 0–32)
AST: 17 IU/L (ref 0–40)
Albumin: 4.4 g/dL (ref 3.9–4.9)
Alkaline Phosphatase: 41 IU/L (ref 41–116)
BUN/Creatinine Ratio: 13 (ref 9–23)
BUN: 9 mg/dL (ref 6–24)
Bilirubin Total: 0.6 mg/dL (ref 0.0–1.2)
CO2: 22 mmol/L (ref 20–29)
Calcium: 9.2 mg/dL (ref 8.7–10.2)
Chloride: 104 mmol/L (ref 96–106)
Creatinine, Ser: 0.72 mg/dL (ref 0.57–1.00)
Globulin, Total: 2 g/dL (ref 1.5–4.5)
Glucose: 79 mg/dL (ref 70–99)
Potassium: 4.4 mmol/L (ref 3.5–5.2)
Sodium: 137 mmol/L (ref 134–144)
Total Protein: 6.4 g/dL (ref 6.0–8.5)
eGFR: 102 mL/min/1.73 (ref >59)

## 2024-05-25 MED ORDER — CYCLOBENZAPRINE HCL 10 MG PO TABS: 10.0000 mg | ORAL_TABLET | Freq: Every day | ORAL | 1 refills | Status: AC

## 2024-05-25 MED ORDER — SYNTHROID 50 MCG PO TABS: 75.0000 ug | ORAL_TABLET | Freq: Every day | ORAL | Status: AC

## 2024-05-25 NOTE — Assessment & Plan Note (Signed)

## 2024-05-25 NOTE — Patient Instructions (Addendum)
 Try neck stretching and using the flexeril at bedtime for about 5 days can be very helpful for the spasm in the neck.  If this is not getting better we can always try PT, which can do dry needling.    For all adult patients, I recommend A well balanced diet low in saturated fats, cholesterol, and moderation in carbohydrates.   This can be as simple as monitoring portion sizes and cutting back on sugary beverages such as soda and juice to start with.    Daily water consumption of at least 64 ounces.  Physical activity at least 180 minutes per week, if just starting out.   This can be as simple as taking the stairs instead of the elevator and walking 2-3 laps around the office  purposefully every day.   STD protection, partner selection, and regular testing if high risk.  Limited consumption of alcoholic beverages if alcohol is consumed.  For women, I recommend no more than 7 alcoholic beverages per week, spread out throughout the week.  Avoid binge drinking or consuming large quantities of alcohol in one setting.   Please let me know if you feel you may need help with reduction or quitting alcohol consumption.   Avoidance of nicotine, if used.  Please let me know if you feel you may need help with reduction or quitting nicotine use.   Daily mental health attention.  This can be in the form of 5 minute daily meditation, prayer, journaling, yoga, reflection, etc.   Purposeful attention to your emotions and mental state can significantly improve your overall wellbeing  and  Health.  Please know that I am here to help you with all of your health care goals and am happy to work with you to find a solution that works best for you.  The greatest advice I have received with any changes in life are to take it one step at a time, that even means if all you can focus on is the next 60 seconds, then do that and celebrate your victories.  With any changes in life, you will have set backs, and that  is OK. The important thing to remember is, if you have a set back, it is not a failure, it is an opportunity to try again!  Health Maintenance Recommendations Screening Testing Mammogram Every 1 -2 years based on history and risk factors Starting at age 71 Pap Smear Ages 21-39 every 3 years Ages 37-65 every 5 years with HPV testing More frequent testing may be required based on results and history Colon Cancer Screening Every 1-10 years based on test performed, risk factors, and history Starting at age 67 Bone Density Screening Every 2-10 years based on history Starting at age 69 for women Recommendations for men differ based on medication usage, history, and risk factors AAA Screening One time ultrasound Men 36-1 years old who have every smoked Lung Cancer Screening Low Dose Lung CT every 12 months Age 67-80 years with a 30 pack-year smoking history who still smoke or who have quit within the last 15 years  Screening Labs Routine  Labs: Complete Blood Count (CBC), Complete Metabolic Panel (CMP), Cholesterol (Lipid Panel) Every 6-12 months based on history and medications May be recommended more frequently based on current conditions or previous results Hemoglobin A1c Lab Every 3-12 months based on history and previous results Starting at age 32 or earlier with diagnosis of diabetes, high cholesterol, BMI >26, and/or risk factors Frequent monitoring for patients with diabetes  to ensure blood sugar control Thyroid  Panel (TSH w/ T3 & T4) Every 6 months based on history, symptoms, and risk factors May be repeated more often if on medication HIV One time testing for all patients 66 and older May be repeated more frequently for patients with increased risk factors or exposure Hepatitis C One time testing for all patients 36 and older May be repeated more frequently for patients with increased risk factors or exposure Gonorrhea, Chlamydia Every 12 months for all sexually active  persons 13-24 years Additional monitoring may be recommended for those who are considered high risk or who have symptoms PSA Men 85-51 years old with risk factors Additional screening may be recommended from age 79-69 based on risk factors, symptoms, and history  Vaccine Recommendations Tetanus Booster All adults every 10 years Flu Vaccine All patients 6 months and older every year COVID Vaccine All patients 12 years and older Initial dosing with booster May recommend additional booster based on age and health history HPV Vaccine 2 doses all patients age 52-26 Dosing may be considered for patients over 26 Shingles Vaccine (Shingrix) 2 doses all adults 55 years and older Pneumonia (Pneumovax 23) All adults 65 years and older May recommend earlier dosing based on health history Pneumonia (Prevnar 30) All adults 65 years and older Dosed 1 year after Pneumovax 23  Additional Screening, Testing, and Vaccinations may be recommended on an individualized basis based on family history, health history, risk factors, and/or exposure.

## 2024-05-26 ENCOUNTER — Ambulatory Visit: Payer: Self-pay | Admitting: Nurse Practitioner

## 2024-05-26 DIAGNOSIS — M62838 Other muscle spasm: Secondary | ICD-10-CM | POA: Insufficient documentation

## 2024-05-26 NOTE — Assessment & Plan Note (Signed)
 Well-controlled with no recent exacerbations. Sensitivity to strong smells noted, but no significant triggers identified. Previous allergy testing was negative. - Refilled inhalers as needed.

## 2024-05-26 NOTE — Assessment & Plan Note (Signed)
 Likely due to prolonged computer use, causing tightness and headaches. No recent trauma or significant injury reported. - Recommended massage therapy. - Prescribed cyclobenzaprine  for muscle relaxation, to be taken at night. - Advised gentle stretching exercises. - Suggested use of a massage gun or rolling pin for muscle relief. - Will consider dry needling if symptoms persist.

## 2025-06-06 ENCOUNTER — Encounter: Admitting: Nurse Practitioner
